# Patient Record
Sex: Female | Born: 1960 | Race: White | Hispanic: No | Marital: Married | Smoking: Former smoker
Health system: Southern US, Community
[De-identification: ages and names within clinical notes are randomized; demographics above are authoritative.]

## PROBLEM LIST (undated history)

## (undated) DIAGNOSIS — C50411 Malignant neoplasm of upper-outer quadrant of right female breast: Secondary | ICD-10-CM

## (undated) DIAGNOSIS — M1990 Unspecified osteoarthritis, unspecified site: Secondary | ICD-10-CM

## (undated) DIAGNOSIS — C50919 Malignant neoplasm of unspecified site of unspecified female breast: Secondary | ICD-10-CM

## (undated) DIAGNOSIS — R011 Cardiac murmur, unspecified: Secondary | ICD-10-CM

## (undated) DIAGNOSIS — K219 Gastro-esophageal reflux disease without esophagitis: Secondary | ICD-10-CM

## (undated) DIAGNOSIS — C801 Malignant (primary) neoplasm, unspecified: Secondary | ICD-10-CM

## (undated) DIAGNOSIS — T7840XA Allergy, unspecified, initial encounter: Secondary | ICD-10-CM

## (undated) HISTORY — DX: Gastro-esophageal reflux disease without esophagitis: K21.9

## (undated) HISTORY — PX: OTHER SURGICAL HISTORY: SHX169

## (undated) HISTORY — DX: Malignant (primary) neoplasm, unspecified: C80.1

## (undated) HISTORY — DX: Unspecified osteoarthritis, unspecified site: M19.90

## (undated) HISTORY — DX: Allergy, unspecified, initial encounter: T78.40XA

## (undated) HISTORY — PX: DILATION AND CURETTAGE OF UTERUS: SHX78

## (undated) HISTORY — DX: Cardiac murmur, unspecified: R01.1

## (undated) HISTORY — DX: Malignant neoplasm of unspecified site of unspecified female breast: C50.919

## (undated) HISTORY — PX: UPPER GI ENDOSCOPY: SHX6162

## (undated) HISTORY — PX: MASTECTOMY: SHX3

---
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
## 2001-09-18 ENCOUNTER — Other Ambulatory Visit: Admission: RE | Admit: 2001-09-18 | Discharge: 2001-09-18 | Payer: Self-pay | Admitting: Family Medicine

## 2004-09-04 ENCOUNTER — Ambulatory Visit: Payer: Self-pay | Admitting: Gastroenterology

## 2008-07-04 DIAGNOSIS — I341 Nonrheumatic mitral (valve) prolapse: Secondary | ICD-10-CM | POA: Insufficient documentation

## 2010-04-03 ENCOUNTER — Ambulatory Visit: Payer: Self-pay | Admitting: Family Medicine

## 2010-04-16 ENCOUNTER — Ambulatory Visit: Payer: Self-pay | Admitting: Family Medicine

## 2010-05-11 ENCOUNTER — Ambulatory Visit: Payer: Self-pay | Admitting: General Surgery

## 2010-05-11 DIAGNOSIS — C50919 Malignant neoplasm of unspecified site of unspecified female breast: Secondary | ICD-10-CM

## 2010-05-11 HISTORY — DX: Malignant neoplasm of unspecified site of unspecified female breast: C50.919

## 2010-05-13 ENCOUNTER — Ambulatory Visit: Payer: Self-pay | Admitting: General Surgery

## 2010-05-14 LAB — CANCER ANTIGEN 27.29: CA 27.29: 19 U/mL (ref 0.0–38.6)

## 2010-05-14 LAB — CEA: CEA: 3.1 ng/mL (ref 0.0–4.7)

## 2010-05-26 ENCOUNTER — Ambulatory Visit: Payer: Self-pay | Admitting: General Surgery

## 2010-05-28 ENCOUNTER — Ambulatory Visit: Payer: Self-pay | Admitting: General Surgery

## 2010-05-28 DIAGNOSIS — C801 Malignant (primary) neoplasm, unspecified: Secondary | ICD-10-CM

## 2010-05-28 HISTORY — PX: BREAST SURGERY: SHX581

## 2010-05-28 HISTORY — DX: Malignant (primary) neoplasm, unspecified: C80.1

## 2010-06-09 ENCOUNTER — Ambulatory Visit: Payer: Self-pay | Admitting: Oncology

## 2010-06-14 ENCOUNTER — Ambulatory Visit: Payer: Self-pay | Admitting: Oncology

## 2010-06-14 HISTORY — PX: COLONOSCOPY: SHX174

## 2010-06-23 ENCOUNTER — Ambulatory Visit: Payer: Self-pay | Admitting: Oncology

## 2010-06-29 ENCOUNTER — Ambulatory Visit: Payer: Self-pay | Admitting: Oncology

## 2010-07-15 ENCOUNTER — Ambulatory Visit: Payer: Self-pay | Admitting: Oncology

## 2010-09-07 ENCOUNTER — Ambulatory Visit: Payer: Self-pay | Admitting: Oncology

## 2010-09-08 LAB — CANCER ANTIGEN 27.29: CA 27.29: 13.7 U/mL (ref 0.0–38.6)

## 2010-09-13 ENCOUNTER — Ambulatory Visit: Payer: Self-pay | Admitting: Oncology

## 2010-10-02 ENCOUNTER — Ambulatory Visit: Payer: Self-pay | Admitting: Family Medicine

## 2010-12-03 ENCOUNTER — Ambulatory Visit: Payer: Self-pay

## 2010-12-10 ENCOUNTER — Ambulatory Visit: Payer: Self-pay

## 2010-12-14 ENCOUNTER — Ambulatory Visit: Payer: Self-pay | Admitting: Oncology

## 2010-12-15 LAB — CANCER ANTIGEN 27.29: CA 27.29: 15.2 U/mL (ref 0.0–38.6)

## 2011-01-13 ENCOUNTER — Ambulatory Visit: Payer: Self-pay | Admitting: Oncology

## 2011-03-09 ENCOUNTER — Ambulatory Visit: Payer: Self-pay | Admitting: General Surgery

## 2011-03-09 HISTORY — PX: COLONOSCOPY: SHX174

## 2011-03-22 ENCOUNTER — Ambulatory Visit: Payer: Self-pay | Admitting: Oncology

## 2011-04-15 ENCOUNTER — Ambulatory Visit: Payer: Self-pay | Admitting: Oncology

## 2011-05-15 ENCOUNTER — Ambulatory Visit: Payer: Self-pay | Admitting: Oncology

## 2011-05-27 ENCOUNTER — Ambulatory Visit: Payer: Self-pay | Admitting: General Surgery

## 2011-05-27 HISTORY — PX: BREAST SURGERY: SHX581

## 2011-05-31 LAB — PATHOLOGY REPORT

## 2011-06-22 ENCOUNTER — Ambulatory Visit: Payer: Self-pay | Admitting: Oncology

## 2011-06-23 LAB — CANCER ANTIGEN 27.29: CA 27.29: 12.9 U/mL (ref 0.0–38.6)

## 2011-07-16 ENCOUNTER — Ambulatory Visit: Payer: Self-pay | Admitting: Oncology

## 2011-12-24 ENCOUNTER — Ambulatory Visit: Payer: Self-pay | Admitting: Oncology

## 2011-12-25 LAB — CANCER ANTIGEN 27.29: CA 27.29: 16.4 U/mL (ref 0.0–38.6)

## 2012-01-13 ENCOUNTER — Ambulatory Visit: Payer: Self-pay | Admitting: Oncology

## 2012-03-02 ENCOUNTER — Ambulatory Visit: Payer: Self-pay | Admitting: Otolaryngology

## 2012-06-22 ENCOUNTER — Ambulatory Visit: Payer: Self-pay | Admitting: Oncology

## 2012-07-15 ENCOUNTER — Ambulatory Visit: Payer: Self-pay | Admitting: Oncology

## 2012-08-30 ENCOUNTER — Telehealth: Payer: Self-pay | Admitting: General Surgery

## 2012-08-30 NOTE — Telephone Encounter (Signed)
08-30-12 PT NEEDS A  SCRIPT FOR PROSTHESIS BILAT & FOR BRAS.PLEASE CALL PT WHEN [phone])/MTH

## 2012-08-31 NOTE — Telephone Encounter (Signed)
RX for bilateral breast prosthesis and mastectomy bras x 4 mailed to patient.

## 2012-12-12 ENCOUNTER — Ambulatory Visit: Payer: Self-pay | Admitting: Oncology

## 2012-12-12 ENCOUNTER — Encounter: Payer: Self-pay | Admitting: *Deleted

## 2012-12-13 ENCOUNTER — Encounter: Payer: Self-pay | Admitting: *Deleted

## 2012-12-25 ENCOUNTER — Ambulatory Visit (INDEPENDENT_AMBULATORY_CARE_PROVIDER_SITE_OTHER): Payer: Managed Care, Other (non HMO) | Admitting: General Surgery

## 2012-12-25 ENCOUNTER — Encounter: Payer: Self-pay | Admitting: General Surgery

## 2012-12-25 VITALS — BP 112/64 | HR 74 | Resp 12 | Ht 65.0 in | Wt 143.0 lb

## 2012-12-25 DIAGNOSIS — K429 Umbilical hernia without obstruction or gangrene: Secondary | ICD-10-CM

## 2012-12-25 NOTE — Patient Instructions (Signed)
Patient to return in January as scheduled.

## 2012-12-25 NOTE — Progress Notes (Signed)
Patient ID: Donna Hoffman, female   DOB: [birth year], 52 y.o.   MRN: [id number]  Chief Complaint  Patient presents with  . Other    evaluation of hernia    HPI Donna Hoffman is a 52 y.o. female who presents for an umbilical hernia evaluation. This hernia has been noted in the past but was unsymptomatic at that time. She states she started to have abdominal pain around the navel for a couple of weeks off and on. She states the pain has subsided. No complaints at this time.   HPI  Past Medical History  Diagnosis Date  . Heart murmur   . GERD (gastroesophageal reflux disease)   . Cancer 1324,4010    breast    Past Surgical History  Procedure Laterality Date  . Dilation and curettage of uterus    . Upper gi endoscopy    . Mastectomy Bilateral 2011,2012  . Colonoscopy  2012  . Breast surgery Bilateral 2011,2012    BIL MASTECTOMY  . Breast surgery Right 2011    Wide excision    Family History  Problem Relation Age of Onset  . Breast cancer Mother   . Colon cancer Paternal Grandfather   . Colon polyps Father     Social History History  Substance Use Topics  . Smoking status: Former Smoker -- 5 years  . Smokeless tobacco: Never Used  . Alcohol Use: No    Allergies  Allergen Reactions  . Neomycin Hives  . Other Other (See Comments)    polymycin - blisters  . Penicillins Itching    Current Outpatient Prescriptions  Medication Sig Dispense Refill  . cholecalciferol (VITAMIN D) 1000 UNITS tablet Take 1,000 Units by mouth daily.      . meloxicam (MOBIC) 15 MG tablet Take 1 tablet by mouth as needed.       No current facility-administered medications for this visit.    Review of Systems Review of Systems  Constitutional: Negative.   Respiratory: Negative.   Cardiovascular: Negative.   Gastrointestinal: Positive for abdominal pain.    Blood pressure 112/64, pulse 74, resp. rate 12, height 5\' 5"  (1.651 m), weight 143 lb (64.864 kg), last menstrual period  12/05/2012.  Physical Exam Physical Exam  Constitutional: She is oriented to person, place, and time. She appears well-developed and well-nourished.  Neck: No thyromegaly present.  Cardiovascular: Normal rate, regular rhythm and normal heart sounds.   No murmur heard. Pulmonary/Chest: Effort normal and breath sounds normal.  Abdominal: Soft. Normal appearance. There is tenderness in the periumbilical area. A hernia is present.  There is a less than 1 cm fascial defect evident at the umbilicus. This is reducible.  Lymphadenopathy:    She has no cervical adenopathy.  Neurological: She is alert and oriented to person, place, and time.  Skin: Skin is warm.    Data Reviewed No new data.  Assessment    Small umbilical hernia, minimally symptomatic     Plan    The patient will report if the area becomes more symptomatic. Follow up otherwise will be as previously scheduled in regards to her breast cancer.        Earline Mayotte 12/26/2012, 9:54 PM

## 2012-12-26 ENCOUNTER — Encounter: Payer: Self-pay | Admitting: General Surgery

## 2013-01-12 ENCOUNTER — Ambulatory Visit: Payer: Self-pay | Admitting: Oncology

## 2013-04-19 ENCOUNTER — Other Ambulatory Visit: Payer: Self-pay

## 2013-04-25 ENCOUNTER — Ambulatory Visit: Payer: Self-pay | Admitting: Obstetrics & Gynecology

## 2013-04-25 LAB — PROTIME-INR
INR: 1
Prothrombin Time: 13.5 secs (ref 11.5–14.7)

## 2013-04-25 LAB — CBC
HCT: 35.5 % (ref 35.0–47.0)
MCH: 30.5 pg (ref 26.0–34.0)
Platelet: 299 10*3/uL (ref 150–440)
RBC: 4.02 10*6/uL (ref 3.80–5.20)
WBC: 8.3 10*3/uL (ref 3.6–11.0)

## 2013-05-04 ENCOUNTER — Ambulatory Visit: Payer: Self-pay | Admitting: Obstetrics & Gynecology

## 2013-05-04 HISTORY — PX: ABDOMINAL HYSTERECTOMY: SHX81

## 2013-05-05 LAB — HEMOGLOBIN: HGB: 11.2 g/dL — ABNORMAL LOW (ref 12.0–16.0)

## 2013-06-29 ENCOUNTER — Ambulatory Visit: Payer: Self-pay | Admitting: Oncology

## 2013-06-30 LAB — CANCER ANTIGEN 27.29: CA 27.29: 10 U/mL (ref 0.0–38.6)

## 2013-07-04 ENCOUNTER — Ambulatory Visit (INDEPENDENT_AMBULATORY_CARE_PROVIDER_SITE_OTHER): Payer: Managed Care, Other (non HMO) | Admitting: General Surgery

## 2013-07-04 ENCOUNTER — Encounter: Payer: Self-pay | Admitting: General Surgery

## 2013-07-04 VITALS — BP 130/76 | HR 74 | Resp 12 | Ht 65.0 in | Wt 141.0 lb

## 2013-07-04 DIAGNOSIS — Z853 Personal history of malignant neoplasm of breast: Secondary | ICD-10-CM | POA: Insufficient documentation

## 2013-07-04 NOTE — Patient Instructions (Signed)
Patient to return in 1 year for follow up.  

## 2013-07-04 NOTE — Progress Notes (Signed)
Patient ID: Donna Hoffman, female   DOB: [birth year], 53 y.o.   MRN: [id number]  Chief Complaint  Patient presents with  . Follow-up    breast cancer    HPI Donna Hoffman is a 53 y.o. female who presents for a breast cancer follow up evaluation. The patient denies any new problems with the breasts at this time. No mammograms done at this time.   HPI  Past Medical History  Diagnosis Date  . Heart murmur   . GERD (gastroesophageal reflux disease)   . Cancer 5277,8242    breast    Past Surgical History  Procedure Laterality Date  . Dilation and curettage of uterus    . Upper gi endoscopy    . Mastectomy Bilateral 2011,2012  . Colonoscopy  2012  . Breast surgery Bilateral 2011,2012    BIL MASTECTOMY  . Breast surgery Right 2011    Wide excision  . Abdominal hysterectomy  05/04/13    total hysterectomy    Family History  Problem Relation Age of Onset  . Breast cancer Mother   . Colon cancer Paternal Grandfather   . Colon polyps Father     Social History History  Substance Use Topics  . Smoking status: Former Smoker -- 5 years  . Smokeless tobacco: Never Used  . Alcohol Use: No    Allergies  Allergen Reactions  . Neomycin Hives  . Other Other (See Comments)    polymycin - blisters  . Penicillins Itching    Current Outpatient Prescriptions  Medication Sig Dispense Refill  . cholecalciferol (VITAMIN D) 1000 UNITS tablet Take 1,000 Units by mouth daily.      . meloxicam (MOBIC) 15 MG tablet Take 1 tablet by mouth as needed.       No current facility-administered medications for this visit.    Review of Systems Review of Systems  Constitutional: Negative.   Respiratory: Negative.   Cardiovascular: Negative.     Blood pressure 130/76, pulse 74, resp. rate 12, height 5\' 5"  (1.651 m), weight 141 lb (63.957 kg), last menstrual period 12/05/2012.  Physical Exam Physical Exam  Constitutional: She is oriented to person, place, and time. She appears  well-developed and well-nourished.  Neck: Neck supple. No thyromegaly present.  Cardiovascular: Normal rate, regular rhythm and normal heart sounds.   No murmur heard. Pulmonary/Chest: Effort normal and breath sounds normal.  Bilateral mastectomy sites well healed.   Lymphadenopathy:    She has no cervical adenopathy.    She has no axillary adenopathy.  Neurological: She is alert and oriented to person, place, and time.  Skin: Skin is warm and dry.    Data Reviewed The patient reports that her CEA 27-29 level is stable as reported by Dr. Grayland Ormond  Assessment    Benign exam, no evidence of right breast cancer recurrence.    Plan    A prescription for new prostheses as well as surgical bra was provided. The patient was offered the opportunity to have her clinical exam was completed with her PCP or Dr. Grayland Ormond. She declined. We'll plan for a followup examination in one year.  The patient reports significant vasomotor symptoms since completion of her hysterectomy and ovary removal 8 weeks ago.  The role of fracture or to control the symptoms was discussed if they do not resolve over the next 4-6 weeks.  She had a ER/PR-negative tumor, estrogen replacement is not totally contraindicated if an SSRI is not of benefit.  Robert Bellow 07/05/2013, 5:27 PM

## 2013-07-05 ENCOUNTER — Encounter: Payer: Self-pay | Admitting: General Surgery

## 2013-07-15 ENCOUNTER — Ambulatory Visit: Payer: Self-pay | Admitting: Oncology

## 2013-07-19 ENCOUNTER — Encounter: Payer: Self-pay | Admitting: General Surgery

## 2013-07-30 ENCOUNTER — Encounter: Payer: Self-pay | Admitting: General Surgery

## 2014-04-15 ENCOUNTER — Encounter: Payer: Self-pay | Admitting: General Surgery

## 2014-06-25 ENCOUNTER — Encounter: Payer: Self-pay | Admitting: General Surgery

## 2014-06-25 ENCOUNTER — Ambulatory Visit (INDEPENDENT_AMBULATORY_CARE_PROVIDER_SITE_OTHER): Payer: Managed Care, Other (non HMO) | Admitting: General Surgery

## 2014-06-25 VITALS — BP 124/80 | HR 78 | Resp 12 | Ht 65.0 in | Wt 149.0 lb

## 2014-06-25 DIAGNOSIS — Z853 Personal history of malignant neoplasm of breast: Secondary | ICD-10-CM

## 2014-06-25 NOTE — Progress Notes (Signed)
Patient ID: Donna Hoffman, female   DOB: May 05, 1961, 54 y.o.   MRN: [id number]  Chief Complaint  Patient presents with  . Breast Cancer Long Term Follow Up    HPI Donna Hoffman is a 54 y.o. female.  Here today for her follow up from left breast cancer and bilateral mastectomies. She states she is doing well and still having hot flashes. She sates she does not seem to be bothered by them at night mostly during the fay. No new issues.   Her hernia does not seem to be bothering her at this time.  HPI  Past Medical History  Diagnosis Date  . Heart murmur   . GERD (gastroesophageal reflux disease)   . 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  Plan adjuvant chemotherapy.  . Malignant neoplasm of breast (female), unspecified site 05/11/2010    Invasive ductal carcinoma, extensive DCIS. BRCA negative    Past Surgical History  Procedure Laterality Date  . Dilation and curettage of uterus    . Upper gi endoscopy    . Mastectomy Bilateral 2011,2012  . Colonoscopy  2012  . Abdominal hysterectomy  05/04/13    total hysterectomy  . Colonoscopy  03/09/2011    Normal exam  . Breast surgery Left 05/27/2011    Left simple mastectomy for benign disease.  . Breast surgery Right 05/28/2010    Right simple mastectomy/sentinel node biopsy    Family History  Problem Relation Age of Onset  . Breast cancer Mother     x2  . Colon cancer Paternal Grandfather   . Colon polyps Father     Social History History  Substance Use Topics  . Smoking status: Former Smoker -- 5 years  . Smokeless tobacco: Never Used  . Alcohol Use: No    Allergies  Allergen Reactions  . Neomycin Hives  . Other Other (See Comments)    polymycin - blisters  . Penicillins Itching    Current Outpatient Prescriptions  Medication Sig Dispense Refill  . cholecalciferol (VITAMIN D) 1000 UNITS tablet Take 1,000 Units by mouth daily.     . meloxicam (MOBIC) 15 MG tablet Take 1 tablet by mouth as needed.     No current facility-administered medications for this visit.    Review of Systems Review of Systems  Constitutional: Negative.   Respiratory: Negative.   Cardiovascular: Negative.     Blood pressure 124/80, pulse 78, resp. rate 12, height 5\' 5"  (1.651 m), weight 149 lb (67.586 kg), last menstrual period 12/05/2012.  Physical Exam Physical Exam  Constitutional: She is oriented to person, place, and time. She appears well-developed and well-nourished.  Neck: Neck supple.  Cardiovascular: Normal rate, regular rhythm and normal heart sounds.   Pulmonary/Chest: Effort normal and breath sounds normal.  Bilateral chest wall incisions well healed. No upper extremity swelling. Full range of motion.  Lymphadenopathy:    She has no cervical adenopathy.    She has no axillary adenopathy.  Neurological: She is alert and oriented to person, place, and time.  Skin: Skin is warm and dry.    Data Reviewed The patient reports BRCA testing completed with Dr. 12/07/2012 was negative except for a variant of unknown significance.  Assessment    Doing well now 4 years status post right mastectomy for T1c malignancy.  Contralateral mastectomy for benign disease.     Plan    The patient desires to  continue annual clinical follow-up.Marland Kitchen Reexamination one year.  We discussed the role for Effexor for hot flashes. At this time she is not interested.     Follow up in one year or sooner if needed.   PCP:  Donna Hoffman Dr. Tonny Hoffman, Donna Hoffman 06/25/2014, 4:13 PM

## 2014-06-25 NOTE — Patient Instructions (Addendum)
The patient is aware to call back for any questions or concerns. Follow up in one year or sooner if needed.

## 2014-07-12 ENCOUNTER — Ambulatory Visit: Payer: Self-pay | Admitting: Oncology

## 2014-07-15 ENCOUNTER — Ambulatory Visit: Payer: Self-pay | Admitting: Oncology

## 2014-07-15 LAB — CANCER ANTIGEN 27.29: CA 27.29: 6.1 U/mL (ref 0.0–38.6)

## 2014-10-04 NOTE — Op Note (Signed)
PATIENT NAME:  Donna Hoffman, Donna Hoffman MR#:  [id number] DATE OF BIRTH:  1960/07/02  DATE OF PROCEDURE:  05/04/2013  PREOPERATIVE DIAGNOSIS: Menorrhagia.   POSTOPERATIVE DIAGNOSIS: Menorrhagia.   PROCEDURE PERFORMED: Complete laparoscopic hysterectomy with bilateral salpingo-oophorectomy.   SURGEON: Barnett Applebaum, M.D.   ANESTHESIA: General.   ESTIMATED BLOOD LOSS: 25 mL.   COMPLICATIONS: None.   FINDINGS: Normal tubes and ovaries with a slightly enlarged uterus, normal appendix, bowel and colon.   DISPOSITION: To the recovery room in stable condition.   TECHNIQUE: The patient is prepped and draped in the usual sterile fashion after adequate anesthesia is obtained in the dorsal lithotomy position. A Foley catheter is placed. A V-Care device is placed on the cervix after the uterus is sounded to 7 cm.   Attention is then turned to the abdomen where a Veress needle is inserted through a 5 mm infraumbilical incision after Marcaine is used to anesthetize the skin. Veress needle placement is confirmed using the hanging drop technique and the abdomen is then insufflated with CO2 gas. A 5 mm trocar is then inserted under direct visualization with the laparoscope with no injuries or bleeding noted. The patient is placed in Trendelenburg position and the above-mentioned findings are visualized.   The right lower quadrant, an 11 mm laparoscopic trocar is placed lateral to the inferior epigastric blood vessels with no injuries or bleeding noted. A 5 mm trocar is placed in the left lower quadrant lateral to the inferior epigastric blood vessels. The Harmonic scalpel was then utilized to coagulate and cut the infundibulopelvic blood vessels and their ligaments to completely amputate the adnexa from their attachments. Dissection is carried up to the round ligaments,  which are carefully coagulated and cut and then the anterior and posterior leaves of the broad ligament are carefully dissected. The uterine arteries are  coagulated using the bipolar cautery device and then they are severed. At this point, the uterus appears to blanch. The cervicovaginal junction is identified with the V-Care device tenting through the structures and a circumferential incision is made using the Harmonic scalpel to completely amputate the uterus with cervix along with the adnexa. It is then removed per the vagina and a sponge is placed to maintain pneumoperitoneum.   The vaginal cuff is closed with Polysorb 0 sutures in an interrupted fashion using the Endo Stitch device. Five sutures are applied and bimanual exam confirms complete closure without leakage of gas. The pelvic cavity is then irrigated with aspiration of all fluid. The patient is leveled. Trocars are removed, gas is expelled, and the skin is closed with Dermabond. The patient goes to recovery room in stable condition. All sponge, instrument, and needle counts are correct.   ____________________________ R. Barnett Applebaum, MD rph:aw D: 05/04/2013 10:54:25 ET T: 05/04/2013 11:06:27 ET JOB#: [id number]  cc: Glean Salen, MD, <Dictator> Gae Dry MD ELECTRONICALLY SIGNED 05/04/2013 15:00

## 2015-01-02 ENCOUNTER — Telehealth: Payer: Self-pay | Admitting: Family Medicine

## 2015-01-02 DIAGNOSIS — R748 Abnormal levels of other serum enzymes: Secondary | ICD-10-CM

## 2015-01-02 NOTE — Telephone Encounter (Signed)
Printed, please notify patient.

## 2015-01-02 NOTE — Telephone Encounter (Signed)
Pt advised.   Thanks,   -Laura  

## 2015-01-02 NOTE — Telephone Encounter (Signed)
Pt is requesting a lab slip to recheck liver enzymes. [id number]

## 2015-01-04 LAB — COMPREHENSIVE METABOLIC PANEL
A/G RATIO: 1.8 (ref 1.1–2.5)
ALT: 13 IU/L (ref 0–32)
AST: 12 IU/L (ref 0–40)
Albumin: 4.3 g/dL (ref 3.5–5.5)
Alkaline Phosphatase: 139 IU/L — ABNORMAL HIGH (ref 39–117)
BILIRUBIN TOTAL: 0.3 mg/dL (ref 0.0–1.2)
BUN/Creatinine Ratio: 25 — ABNORMAL HIGH (ref 9–23)
BUN: 15 mg/dL (ref 6–24)
CO2: 26 mmol/L (ref 18–29)
Calcium: 9.7 mg/dL (ref 8.7–10.2)
Chloride: 100 mmol/L (ref 97–108)
Creatinine, Ser: 0.61 mg/dL (ref 0.57–1.00)
GFR, EST AFRICAN AMERICAN: 119 mL/min/{1.73_m2} (ref >59)
GFR, EST NON AFRICAN AMERICAN: 103 mL/min/{1.73_m2} (ref >59)
Globulin, Total: 2.4 g/dL (ref 1.5–4.5)
Glucose: 84 mg/dL (ref 65–99)
POTASSIUM: 4.4 mmol/L (ref 3.5–5.2)
Sodium: 142 mmol/L (ref 134–144)
Total Protein: 6.7 g/dL (ref 6.0–8.5)

## 2015-01-07 ENCOUNTER — Telehealth: Payer: Self-pay

## 2015-01-07 NOTE — Telephone Encounter (Signed)
-----   Message from Margarita Rana, MD sent at 01/06/2015  1:32 PM EDT ----- The same liver enzyme is still elevated. Very mildly so.  Recommend check ALP isoenzymes in one week. Thanks.

## 2015-01-07 NOTE — Telephone Encounter (Signed)
Pt advised as directed below.  Thanks,   -Joseline

## 2015-01-10 ENCOUNTER — Other Ambulatory Visit: Payer: Self-pay | Admitting: Family Medicine

## 2015-01-10 ENCOUNTER — Other Ambulatory Visit: Payer: Self-pay

## 2015-01-10 DIAGNOSIS — R748 Abnormal levels of other serum enzymes: Secondary | ICD-10-CM

## 2015-01-15 LAB — ALKALINE PHOSPHATASE, ISOENZYMES
BONE FRACTION: 62 % (ref 14–68)
INTESTINAL FRAC.: 10 % (ref 0–18)
LIVER FRACTION: 28 % (ref 18–85)

## 2015-01-15 LAB — COMPREHENSIVE METABOLIC PANEL
A/G RATIO: 2 (ref 1.1–2.5)
ALK PHOS: 136 IU/L — AB (ref 39–117)
ALT: 14 IU/L (ref 0–32)
AST: 15 IU/L (ref 0–40)
Albumin: 4.5 g/dL (ref 3.5–5.5)
BUN/Creatinine Ratio: 25 — ABNORMAL HIGH (ref 9–23)
BUN: 17 mg/dL (ref 6–24)
Bilirubin Total: 0.3 mg/dL (ref 0.0–1.2)
CALCIUM: 9.9 mg/dL (ref 8.7–10.2)
CO2: 25 mmol/L (ref 18–29)
CREATININE: 0.67 mg/dL (ref 0.57–1.00)
Chloride: 103 mmol/L (ref 97–108)
GFR calc Af Amer: 115 mL/min/{1.73_m2} (ref >59)
GFR calc non Af Amer: 100 mL/min/{1.73_m2} (ref >59)
GLOBULIN, TOTAL: 2.2 g/dL (ref 1.5–4.5)
Glucose: 74 mg/dL (ref 65–99)
Potassium: 4.6 mmol/L (ref 3.5–5.2)
SODIUM: 145 mmol/L — AB (ref 134–144)
Total Protein: 6.7 g/dL (ref 6.0–8.5)

## 2015-01-16 ENCOUNTER — Telehealth: Payer: Self-pay

## 2015-01-16 DIAGNOSIS — R748 Abnormal levels of other serum enzymes: Secondary | ICD-10-CM

## 2015-01-16 NOTE — Telephone Encounter (Signed)
Put in order for patient. They should be calling her with appointment time.  Thanks.

## 2015-01-16 NOTE — Telephone Encounter (Signed)
Left message to call back  

## 2015-01-16 NOTE — Telephone Encounter (Signed)
LMTCB 01/16/2015  Thanks,   -Mickel Baas

## 2015-01-16 NOTE — Telephone Encounter (Signed)
Advised pt as directed below.she would like to know if you would order an Ultrasound.  Thanks,

## 2015-01-16 NOTE — Telephone Encounter (Signed)
-----  Message from Margarita Rana, MD sent at 01/16/2015  6:42 AM EDT ----- Isoenzymes look good. Would repeat met c in 3 months for stability. Thanks.

## 2015-01-17 ENCOUNTER — Telehealth: Payer: Self-pay | Admitting: Family Medicine

## 2015-01-22 ENCOUNTER — Ambulatory Visit: Payer: Managed Care, Other (non HMO)

## 2015-01-28 ENCOUNTER — Ambulatory Visit
Admission: RE | Admit: 2015-01-28 | Discharge: 2015-01-28 | Disposition: A | Discharge status: Home / Self Care | Payer: Managed Care, Other (non HMO) | Source: Ambulatory Visit | Attending: Family Medicine | Admitting: Family Medicine

## 2015-01-28 ENCOUNTER — Telehealth: Payer: Self-pay

## 2015-01-28 DIAGNOSIS — R748 Abnormal levels of other serum enzymes: Secondary | ICD-10-CM | POA: Diagnosis present

## 2015-01-28 NOTE — Telephone Encounter (Signed)
Advised pt as directed below, pt verbalized fully understanding.  Thanks

## 2015-01-28 NOTE — Telephone Encounter (Signed)
-----   Message from Margarita Rana, MD sent at 01/28/2015  9:54 AM EDT ----- Normal Ultrasound. Please notify patient. Repeat labs in several months as discussed.  Thanks.

## 2015-05-14 ENCOUNTER — Encounter: Payer: Self-pay | Admitting: *Deleted

## 2015-06-05 ENCOUNTER — Encounter: Payer: Self-pay | Admitting: Family Medicine

## 2015-06-05 ENCOUNTER — Ambulatory Visit
Admission: RE | Admit: 2015-06-05 | Discharge: 2015-06-05 | Disposition: A | Discharge status: Home / Self Care | Payer: Managed Care, Other (non HMO) | Source: Ambulatory Visit | Attending: Family Medicine | Admitting: Family Medicine

## 2015-06-05 ENCOUNTER — Ambulatory Visit (INDEPENDENT_AMBULATORY_CARE_PROVIDER_SITE_OTHER): Payer: Managed Care, Other (non HMO) | Admitting: Family Medicine

## 2015-06-05 VITALS — BP 130/80 | HR 68 | Temp 97.9°F | Resp 16 | Ht 65.0 in | Wt 153.0 lb

## 2015-06-05 DIAGNOSIS — E559 Vitamin D deficiency, unspecified: Secondary | ICD-10-CM

## 2015-06-05 DIAGNOSIS — Z0000 Encounter for general adult medical examination without abnormal findings: Secondary | ICD-10-CM | POA: Diagnosis not present

## 2015-06-05 DIAGNOSIS — M7712 Lateral epicondylitis, left elbow: Secondary | ICD-10-CM

## 2015-06-05 DIAGNOSIS — I889 Nonspecific lymphadenitis, unspecified: Secondary | ICD-10-CM | POA: Insufficient documentation

## 2015-06-05 DIAGNOSIS — N926 Irregular menstruation, unspecified: Secondary | ICD-10-CM | POA: Insufficient documentation

## 2015-06-05 DIAGNOSIS — C50411 Malignant neoplasm of upper-outer quadrant of right female breast: Secondary | ICD-10-CM | POA: Insufficient documentation

## 2015-06-05 DIAGNOSIS — M25559 Pain in unspecified hip: Secondary | ICD-10-CM | POA: Insufficient documentation

## 2015-06-05 DIAGNOSIS — E7800 Pure hypercholesterolemia, unspecified: Secondary | ICD-10-CM | POA: Insufficient documentation

## 2015-06-05 DIAGNOSIS — Z6825 Body mass index (BMI) 25.0-25.9, adult: Secondary | ICD-10-CM | POA: Insufficient documentation

## 2015-06-05 DIAGNOSIS — R748 Abnormal levels of other serum enzymes: Secondary | ICD-10-CM

## 2015-06-05 DIAGNOSIS — M1990 Unspecified osteoarthritis, unspecified site: Secondary | ICD-10-CM

## 2015-06-05 DIAGNOSIS — M5136 Other intervertebral disc degeneration, lumbar region: Secondary | ICD-10-CM | POA: Insufficient documentation

## 2015-06-05 DIAGNOSIS — M542 Cervicalgia: Secondary | ICD-10-CM | POA: Insufficient documentation

## 2015-06-05 DIAGNOSIS — D709 Neutropenia, unspecified: Secondary | ICD-10-CM | POA: Insufficient documentation

## 2015-06-05 DIAGNOSIS — M719 Bursopathy, unspecified: Secondary | ICD-10-CM | POA: Insufficient documentation

## 2015-06-05 DIAGNOSIS — M545 Low back pain: Secondary | ICD-10-CM | POA: Diagnosis present

## 2015-06-05 HISTORY — DX: Malignant neoplasm of upper-outer quadrant of right female breast: C50.411

## 2015-06-05 NOTE — Progress Notes (Signed)
Patient ID: Donna Hoffman, female   DOB: 1961-05-26, 54 y.o.   MRN: [id number]        Patient: Donna Hoffman, Female    DOB: 09/06/1960, 54 y.o.   MRN: [id number] Visit Date: 06/05/2015  Today's Provider: Margarita Rana, MD   Chief Complaint  Patient presents with  . Annual Exam   Subjective:    Annual physical exam Donna Hoffman is a 54 y.o. female who presents today for health maintenance and complete physical. She feels well. She reports exercising staying active with daily activities. She reports she is sleeping well.  05/24/14 CPE 08/01/08 Pap-neg; HPV-neg, hysterectomy on 04/2013 03/05/11 Colon-normal -----------------------------------------------------------------  Back Pain: Patient presents for presents evaluation of low back problems.  Symptoms have been present for 1 month and include pain in lower back (aching in character; 7/10 in severity) and stiffness in lower back. Initial inciting event: none. Symptoms are worst: morning. Alleviating factors identifiable by patient are heat. Exacerbating factors identifiable by patient are standing. Treatments so far initiated by patient: mobic Previous lower back problems: osteoarthirits. Previous workup: none. Previous treatments: none.  Numbness/Muscle Pain: Patient complains of numbness for which has been present on and off for 2 months. Numbness is located in left arm and is intermittent.  Associated symptoms include: none.  The patient has tried nothing for pain relief.  Related to injury:   Yes, pt reports she had a fall in the summer.   Review of Systems  Constitutional: Negative.   HENT: Negative.   Eyes: Negative.   Respiratory: Negative.   Cardiovascular: Negative.   Gastrointestinal: Negative.   Endocrine: Negative.   Genitourinary: Negative.   Musculoskeletal: Positive for back pain and arthralgias.  Allergic/Immunologic: Negative.   Neurological: Positive for numbness.  Hematological: Negative.     Psychiatric/Behavioral: Negative.     Social History      She  reports that she has quit smoking. She has never used smokeless tobacco. She reports that she does not drink alcohol or use illicit drugs.       Social History   Social History  . Marital Status: Married    Spouse Name: N/A  . Number of Children: N/A  . Years of Education: N/A   Social History Main Topics  . Smoking status: Former Smoker -- 5 years  . Smokeless tobacco: Never Used  . Alcohol Use: No  . Drug Use: No  . Sexual Activity: Not Asked   Other Topics Concern  . None   Social History Narrative    Patient Active Problem List   Diagnosis Date Noted  . Body mass index (BMI) of 25.0-25.9 in adult 06/05/2015  . Malignant neoplasm of breast ([place]) 06/05/2015  . Bursitis 06/05/2015  . Hypercholesterolemia 06/05/2015  . Irregular bleeding 06/05/2015  . Arthralgia of hip 06/05/2015  . Adenitis 06/05/2015  . Cervical pain 06/05/2015  . Neutropenia ([place]) 06/05/2015  . Avitaminosis D 06/05/2015  . Elevated liver enzymes 01/02/2015  . History of breast cancer 07/04/2013  . Umbilical hernia [id number]  . Allergic rhinitis 07/29/2008  . Acid reflux 07/04/2008  . Disorder of mitral valve 07/04/2008    Past Surgical History  Procedure Laterality Date  . Dilation and curettage of uterus    . Upper gi endoscopy    . Mastectomy Bilateral 2011,2012  . Colonoscopy  2012  . Abdominal hysterectomy  05/04/13    total hysterectomy  . Colonoscopy  03/09/2011    Normal exam  . Breast surgery Left  05/27/2011    Left simple mastectomy for benign disease.  . Breast surgery Right 05/28/2010    Right simple mastectomy/sentinel node biopsy    Family History        Family Status  Relation Status Death Age  . Mother Alive   . Father Alive   . Sister Alive   . Brother Alive   . Sister Alive   . Sister Alive         Her family history includes Arthritis in her mother; Breast cancer in her mother; Cancer in her  father and mother; Colon cancer in her paternal grandfather; Colon polyps in her father; Diabetes in her father; Healthy in her sister, sister, and sister; Hypertension in her father.    Allergies  Allergen Reactions  . Epinephrine   . Neomycin Hives  . Other Other (See Comments)    polymycin - blisters  . Penicillins Itching    Previous Medications   CHOLECALCIFEROL (VITAMIN D) 1000 UNITS TABLET    Take 1,000 Units by mouth daily.   MELOXICAM (MOBIC) 15 MG TABLET    Take 1 tablet by mouth as needed.    Patient Care Team: Margarita Rana, MD as PCP - General (Family Medicine) Robert Bellow, MD (General Surgery) Margarita Rana, MD as Referring Physician (Family Medicine)     Objective:   Vitals: BP 130/80 mmHg  Pulse 68  Temp(Src) 97.9 F (36.6 C) (Oral)  Resp 16  Ht 5\' 5"  (1.651 m)  Wt 153 lb (69.4 kg)  BMI 25.46 kg/m2  SpO2 96%  LMP 12/05/2012   Physical Exam  Constitutional: She is oriented to person, place, and time. She appears well-developed and well-nourished.  HENT:  Head: Normocephalic and atraumatic.  Right Ear: Tympanic membrane, external ear and ear canal normal.  Left Ear: Tympanic membrane, external ear and ear canal normal.  Nose: Nose normal.  Mouth/Throat: Uvula is midline, oropharynx is clear and moist and mucous membranes are normal.  Eyes: Conjunctivae, EOM and lids are normal. Pupils are equal, round, and reactive to light.  Neck: Trachea normal and normal range of motion. Neck supple. Carotid bruit is not present. No thyroid mass and no thyromegaly present.  Cardiovascular: Normal rate, regular rhythm and normal heart sounds.   Pulmonary/Chest: Effort normal and breath sounds normal.  Abdominal: Soft. Normal appearance and bowel sounds are normal. There is no hepatosplenomegaly. There is no tenderness.  Musculoskeletal: Normal range of motion.  Lymphadenopathy:    She has no cervical adenopathy.    She has no axillary adenopathy.    Neurological: She is alert and oriented to person, place, and time. She has normal strength. No cranial nerve deficit.  Skin: Skin is warm, dry and intact.  Psychiatric: She has a normal mood and affect. Her speech is normal and behavior is normal. Judgment and thought content normal. Cognition and memory are normal.     Depression Screen PHQ 2/9 Scores 06/05/2015  PHQ - 2 Score 0      Assessment & Plan:     Routine Health Maintenance and Physical Exam  Exercise Activities and Dietary recommendations Goals    . Exercise 150 minutes per week (moderate activity)       Immunization History  Administered Date(s) Administered  . Td 06/14/2006   1. Annual physical exam As above.    2. Elevated liver enzymes Recheck labs. ALP was elevated, iso-enzymes normal. Will monitor.   - CBC with Differential/Platelet - Comprehensive metabolic panel - TSH  3. Hypercholesterolemia Stabe  - Lipid Panel With LDL/HDL Ratio  4. Avitaminosis D Stable.  - VITAMIN D 25 Hydroxy (Vit-D Deficiency, Fractures)  5. Arthritis Will check labs.  Unclear if  Osteoarthritis or rheumatoid. Will also check Xray. Encouraged continued walking for exercise.    - CYCLIC CITRUL PEPTIDE ANTIBODY, IGG/IGA - ANA - Sedimentation rate - Rheumatoid factor - DG Lumbar Spine Complete; Future  6. Lateral epicondylitis, left Anti-inflammatories, ice and rub. No lifting. Patient instructed to call back if condition worsens or does not improve.       Patient was seen and examined by Jerrell Belfast, MD, and note scribed by Lynford Humphrey, [place].    I have reviewed the document for accuracy and completeness and I agree with above. Jerrell Belfast, MD   Margarita Rana, MD    --------------------------------------------------------------------

## 2015-06-06 ENCOUNTER — Telehealth: Payer: Self-pay

## 2015-06-06 NOTE — Telephone Encounter (Signed)
Pt informed and voiced understanding of results. 

## 2015-06-06 NOTE — Telephone Encounter (Signed)
LMTCB 06/06/2015  Thanks,   -Mickel Baas

## 2015-06-06 NOTE — Telephone Encounter (Signed)
-----   Message from Margarita Rana, MD sent at 06/05/2015  5:04 PM EST ----- Does have degenerative disc disease and facet joint changes consistent with osteoarthritis. Further plan pending labs.  Thanks.

## 2015-06-12 ENCOUNTER — Telehealth: Payer: Self-pay

## 2015-06-12 LAB — COMPREHENSIVE METABOLIC PANEL
A/G RATIO: 1.7 (ref 1.1–2.5)
ALT: 13 IU/L (ref 0–32)
AST: 17 IU/L (ref 0–40)
Albumin: 4.7 g/dL (ref 3.5–5.5)
Alkaline Phosphatase: 151 IU/L — ABNORMAL HIGH (ref 39–117)
BUN/Creatinine Ratio: 23 (ref 9–23)
BUN: 15 mg/dL (ref 6–24)
Bilirubin Total: 0.4 mg/dL (ref 0.0–1.2)
CALCIUM: 9.8 mg/dL (ref 8.7–10.2)
CO2: 25 mmol/L (ref 18–29)
CREATININE: 0.66 mg/dL (ref 0.57–1.00)
Chloride: 102 mmol/L (ref 96–106)
GFR, EST AFRICAN AMERICAN: 116 mL/min/{1.73_m2} (ref >59)
GFR, EST NON AFRICAN AMERICAN: 100 mL/min/{1.73_m2} (ref >59)
Globulin, Total: 2.7 g/dL (ref 1.5–4.5)
Glucose: 86 mg/dL (ref 65–99)
POTASSIUM: 4.5 mmol/L (ref 3.5–5.2)
Sodium: 145 mmol/L — ABNORMAL HIGH (ref 134–144)
TOTAL PROTEIN: 7.4 g/dL (ref 6.0–8.5)

## 2015-06-12 LAB — CBC WITH DIFFERENTIAL/PLATELET
Basophils Absolute: 0 10*3/uL (ref 0.0–0.2)
Basos: 1 %
EOS (ABSOLUTE): 0.1 10*3/uL (ref 0.0–0.4)
EOS: 3 %
Hematocrit: 40.5 % (ref 34.0–46.6)
Hemoglobin: 13.1 g/dL (ref 11.1–15.9)
IMMATURE GRANS (ABS): 0 10*3/uL (ref 0.0–0.1)
IMMATURE GRANULOCYTES: 1 %
LYMPHS: 33 %
Lymphocytes Absolute: 1.4 10*3/uL (ref 0.7–3.1)
MCH: 28.2 pg (ref 26.6–33.0)
MCHC: 32.3 g/dL (ref 31.5–35.7)
MCV: 87 fL (ref 79–97)
Monocytes Absolute: 0.3 10*3/uL (ref 0.1–0.9)
Monocytes: 7 %
NEUTROS PCT: 55 %
Neutrophils Absolute: 2.4 10*3/uL (ref 1.4–7.0)
PLATELETS: 301 10*3/uL (ref 150–379)
RBC: 4.64 x10E6/uL (ref 3.77–5.28)
RDW: 14.3 % (ref 12.3–15.4)
WBC: 4.3 10*3/uL (ref 3.4–10.8)

## 2015-06-12 LAB — TSH: TSH: 0.851 u[IU]/mL (ref 0.450–4.500)

## 2015-06-12 LAB — CYCLIC CITRUL PEPTIDE ANTIBODY, IGG/IGA: Cyclic Citrullin Peptide Ab: 12 units (ref 0–19)

## 2015-06-12 LAB — LIPID PANEL WITH LDL/HDL RATIO
Cholesterol, Total: 255 mg/dL — ABNORMAL HIGH (ref 100–199)
HDL: 77 mg/dL (ref >39)
LDL CALC: 156 mg/dL — AB (ref 0–99)
LDl/HDL Ratio: 2 ratio units (ref 0.0–3.2)
Triglycerides: 111 mg/dL (ref 0–149)
VLDL Cholesterol Cal: 22 mg/dL (ref 5–40)

## 2015-06-12 LAB — SEDIMENTATION RATE: Sed Rate: 3 mm/hr (ref 0–40)

## 2015-06-12 LAB — ANA: ANA: NEGATIVE

## 2015-06-12 LAB — VITAMIN D 25 HYDROXY (VIT D DEFICIENCY, FRACTURES): Vit D, 25-Hydroxy: 28.4 ng/mL — ABNORMAL LOW (ref 30.0–100.0)

## 2015-06-12 LAB — RHEUMATOID FACTOR: Rhuematoid fact SerPl-aCnc: <10 IU/mL (ref 0.0–13.9)

## 2015-06-12 NOTE — Telephone Encounter (Signed)
-----  Message from Nancy Maloney, MD sent at 06/12/2015  6:47 AM EST ----- Labs stable.  Please add Alk  Phos Isoenzymes if possible. ALso  Cholesterol elevated at 255 but has such high good cholesterol 10 year risk of heart disease is only 2 percent and medication is not recommended.   Vit is slightly below normal. Please clarify if taking any Vit D supplement. Markers for arthritis show no inflammatory arthritis.  Thanks. 

## 2015-06-12 NOTE — Telephone Encounter (Signed)
Talked with patient. Will treat symptoms. Stay active.  Call if worsens or does not improve.  Thanks.

## 2015-06-12 NOTE — Telephone Encounter (Signed)
[place] to add on test as below. Left message for Ms. Deroo to return my call for lab results. Renaldo Fiddler, CMA

## 2015-06-12 NOTE — Telephone Encounter (Signed)
Advised pt of lab results. Pt verbally acknowledges understanding. Pt would like to know what to do about the OA and DDD that was found in her spine x ray. Please advise. Renaldo Fiddler, CMA

## 2015-06-25 ENCOUNTER — Ambulatory Visit (INDEPENDENT_AMBULATORY_CARE_PROVIDER_SITE_OTHER): Payer: Managed Care, Other (non HMO) | Admitting: General Surgery

## 2015-06-25 ENCOUNTER — Encounter: Payer: Self-pay | Admitting: General Surgery

## 2015-06-25 VITALS — BP 124/80 | HR 74 | Resp 12 | Ht 65.0 in | Wt 152.0 lb

## 2015-06-25 DIAGNOSIS — Z853 Personal history of malignant neoplasm of breast: Secondary | ICD-10-CM | POA: Diagnosis not present

## 2015-06-25 NOTE — Patient Instructions (Addendum)
Patient to return for a follow up in one year.

## 2015-06-25 NOTE — Progress Notes (Signed)
Patient ID: Donna Hoffman, female   DOB: August 31, 1960, 55 y.o.   MRN: [id number]  Chief Complaint  Patient presents with  . Other    breast cancer follow up    HPI Donna Hoffman is a 55 y.o. female here today for her one year breast cancer check. Patient states she is doing well. She did notice over the summer she had a small white knot developed that lasted one day on the right mastectomy incision, no trouble since.  I reviewed the patient's history.   HPI  Past Medical History  Diagnosis Date  . Heart murmur   . GERD (gastroesophageal reflux disease)   . 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  Plan adjuvant chemotherapy.  . Malignant neoplasm of breast (female), unspecified site 05/11/2010    Invasive ductal carcinoma, extensive DCIS. BRCA negative    Past Surgical History  Procedure Laterality Date  . Dilation and curettage of uterus    . Upper gi endoscopy    . Mastectomy Bilateral 2011,2012  . Colonoscopy  2012  . Abdominal hysterectomy  05/04/13    total hysterectomy  . Colonoscopy  03/09/2011    Normal exam  . Breast surgery Left 05/27/2011    Left simple mastectomy for benign disease.  . Breast surgery Right 05/28/2010    Right simple mastectomy/sentinel node biopsy    Family History  Problem Relation Age of Onset  . Breast cancer Mother     x2  . Arthritis Mother   . Cancer Mother     breast  . Colon cancer Paternal Grandfather   . Colon polyps Father   . Diabetes Father   . Hypertension Father   . Cancer Father     skin  . Healthy Sister   . Healthy Sister   . Healthy Sister     Social History Social History  Substance Use Topics  . Smoking status: Former Smoker -- 5 years  . Smokeless tobacco: Never Used  . Alcohol Use: No    Allergies  Allergen Reactions  . Epinephrine   . Neomycin Hives  . Other Other (See Comments)    polymycin - blisters  .  Penicillins Itching    Current Outpatient Prescriptions  Medication Sig Dispense Refill  . cholecalciferol (VITAMIN D) 1000 UNITS tablet Take 1,000 Units by mouth daily.    . meloxicam (MOBIC) 15 MG tablet Take 1 tablet by mouth as needed.     No current facility-administered medications for this visit.    Review of Systems Review of Systems  Constitutional: Negative.   Respiratory: Negative.   Cardiovascular: Negative.     Blood pressure 124/80, pulse 74, resp. rate 12, height '5\' 5"'$  (1.651 m), weight 152 lb (68.947 kg), last menstrual period 12/05/2012.  Physical Exam Physical Exam  Constitutional: She is oriented to person, place, and time. She appears well-developed and well-nourished.  HENT:  Mouth/Throat: Oropharynx is clear and moist.  Eyes: Conjunctivae are normal. No scleral icterus.  Neck: Neck supple.  Cardiovascular: Normal rate, regular rhythm and normal heart sounds.   Pulmonary/Chest: Effort normal and breath sounds normal.    Bilateral chest incisions well healed.  Lymphadenopathy:    She has no cervical adenopathy.  Neurological: She is alert and oriented to person, place, and time.  Skin: Skin is warm.  Psychiatric: Her behavior is normal.    Data Reviewed None.  Assessment  Doing well now 5 years status post right mastectomy for a T1c hormone receptor negative, HER-2/neu amplified malignancy treated by mastectomy without adjuvant chemotherapy. Subsequent contralateral reflected mastectomy.    Plan    New prescription provided for breast prosthesis and surgical bras.  The opportunity to defer routine surgical follow-up was offered but declined.     Patient to return for a follow up in one year.   PCP:  Margarita Rana This information has been scribed by Karie Fetch [place].  Robert Bellow 06/25/2015, 8:33 PM

## 2015-07-02 LAB — ALKALINE PHOSPHATASE, ISOENZYMES
ALK PHOS: 146 IU/L — AB (ref 39–117)
BONE FRACTION: 42 % (ref 14–68)
INTESTINAL FRAC.: 7 % (ref 0–18)
LIVER FRACTION: 51 % (ref 18–85)

## 2015-07-02 LAB — SPECIMEN STATUS REPORT

## 2015-07-09 ENCOUNTER — Other Ambulatory Visit: Payer: Self-pay | Admitting: *Deleted

## 2015-07-09 DIAGNOSIS — C50919 Malignant neoplasm of unspecified site of unspecified female breast: Secondary | ICD-10-CM

## 2015-07-11 ENCOUNTER — Inpatient Hospital Stay: Payer: Managed Care, Other (non HMO) | Attending: Oncology

## 2015-07-11 ENCOUNTER — Inpatient Hospital Stay (HOSPITAL_BASED_OUTPATIENT_CLINIC_OR_DEPARTMENT_OTHER): Payer: Managed Care, Other (non HMO) | Admitting: Oncology

## 2015-07-11 VITALS — BP 123/80 | HR 61 | Temp 97.8°F | Resp 16 | Wt 151.5 lb

## 2015-07-11 DIAGNOSIS — Z87891 Personal history of nicotine dependence: Secondary | ICD-10-CM | POA: Diagnosis not present

## 2015-07-11 DIAGNOSIS — C50911 Malignant neoplasm of unspecified site of right female breast: Secondary | ICD-10-CM

## 2015-07-11 DIAGNOSIS — R011 Cardiac murmur, unspecified: Secondary | ICD-10-CM | POA: Insufficient documentation

## 2015-07-11 DIAGNOSIS — Z803 Family history of malignant neoplasm of breast: Secondary | ICD-10-CM | POA: Insufficient documentation

## 2015-07-11 DIAGNOSIS — Z800 Family history of malignant neoplasm of digestive organs: Secondary | ICD-10-CM | POA: Diagnosis not present

## 2015-07-11 DIAGNOSIS — Z9013 Acquired absence of bilateral breasts and nipples: Secondary | ICD-10-CM | POA: Insufficient documentation

## 2015-07-11 DIAGNOSIS — Z853 Personal history of malignant neoplasm of breast: Secondary | ICD-10-CM | POA: Diagnosis not present

## 2015-07-11 DIAGNOSIS — C50919 Malignant neoplasm of unspecified site of unspecified female breast: Secondary | ICD-10-CM

## 2015-07-11 NOTE — Progress Notes (Signed)
[facility]  [phone] [phone]) [phone]  ID: Donna Hoffman OB: October 08, 1960  MR#: [id number]  TOT#:[id number]  Patient Care Team: Lorie Phenix, MD as PCP - General (Family Medicine) Earline Mayotte, MD (General Surgery) Lorie Phenix, MD as Referring Physician (Family Medicine)  CHIEF COMPLAINT:  Chief Complaint  Patient presents with  . Breast Cancer    INTERVAL HISTORY: Patient returns to clinic for routine yearly evaluation.  She continues to feel well and is asymptomatic. She denies any pain.  She has no neurologic complaints.  She has no chest pain or shortness of breath.  She has a good appetite and has maintained her weight.  She denies any nausea, vomiting, constipation, or diarrhea.  She has no urinary complaints.  Patient offers no specific complaints today.  REVIEW OF SYSTEMS:   Review of Systems  Constitutional: Negative.  Negative for fever, weight loss and malaise/fatigue.  Respiratory: Negative.   Cardiovascular: Negative.   Gastrointestinal: Negative.   Musculoskeletal: Negative.   Neurological: Negative.     As per HPI. Otherwise, a complete review of systems is negatve.  PAST MEDICAL HISTORY: Past Medical History  Diagnosis Date  . Heart murmur   . GERD (gastroesophageal reflux disease)   . 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  Plan adjuvant chemotherapy.  . Malignant neoplasm of breast (female), unspecified site 05/11/2010    Invasive ductal carcinoma, extensive DCIS. BRCA negative    PAST SURGICAL HISTORY: Past Surgical History  Procedure Laterality Date  . Dilation and curettage of uterus    . Upper gi endoscopy    . Mastectomy Bilateral 2011,2012  . Colonoscopy  2012  . Abdominal hysterectomy  05/04/13    total hysterectomy  . Colonoscopy  03/09/2011    Normal exam  . Breast surgery Left 05/27/2011    Left simple  mastectomy for benign disease.  . Breast surgery Right 05/28/2010    Right simple mastectomy/sentinel node biopsy    FAMILY HISTORY Family History  Problem Relation Age of Onset  . Breast cancer Mother     x2  . Arthritis Mother   . Cancer Mother     breast  . Colon cancer Paternal Grandfather   . Colon polyps Father   . Diabetes Father   . Hypertension Father   . Cancer Father     skin  . Healthy Sister   . Healthy Sister   . Healthy Sister        ADVANCED DIRECTIVES:    HEALTH MAINTENANCE: Social History  Substance Use Topics  . Smoking status: Former Smoker -- 5 years  . Smokeless tobacco: Never Used  . Alcohol Use: No     Colonoscopy:  PAP:  Bone density:  Lipid panel:  Allergies  Allergen Reactions  . Epinephrine   . Neomycin Hives  . Other Other (See Comments)    polymycin - blisters  . Penicillins Itching    Current Outpatient Prescriptions  Medication Sig Dispense Refill  . cholecalciferol (VITAMIN D) 1000 UNITS tablet Take 1,000 Units by mouth daily.    . meloxicam (MOBIC) 15 MG tablet Take 1 tablet by mouth as needed.     No current facility-administered medications for this visit.    OBJECTIVE: Filed Vitals:   07/11/15 1130  BP: 123/80  Pulse: 61  Temp: 97.8 F (36.6 C)  Resp: 16     Body mass index  is 25.2 kg/(m^2).    ECOG FS:0 - Asymptomatic  General: Well-developed, well-nourished, no acute distress. Eyes: Pink conjunctiva, anicteric sclera. Breasts: Bilateral mastectomy. Exam deferred today. Lungs: Clear to auscultation bilaterally. Heart: Regular rate and rhythm. No rubs, murmurs, or gallops. Abdomen: Soft, nontender, nondistended. No organomegaly noted, normoactive bowel sounds. Musculoskeletal: No edema, cyanosis, or clubbing. Neuro: Alert, answering all questions appropriately. Cranial nerves grossly intact. Skin: No rashes or petechiae noted. Psych: Normal affect.  LAB RESULTS:  Lab Results  Component Value Date    NA 145* 06/10/2015   K 4.5 06/10/2015   CL 102 06/10/2015   CO2 25 06/10/2015   GLUCOSE 86 06/10/2015   BUN 15 06/10/2015   CREATININE 0.66 06/10/2015   CALCIUM 9.8 06/10/2015   PROT 7.4 06/10/2015   ALBUMIN 4.7 06/10/2015   AST 17 06/10/2015   ALT 13 06/10/2015   ALKPHOS 151* 06/10/2015   ALKPHOS 146* 06/10/2015   BILITOT 0.4 06/10/2015   GFRNONAA 100 06/10/2015   GFRAA 116 06/10/2015    Lab Results  Component Value Date   WBC 4.3 06/10/2015   NEUTROABS 2.4 06/10/2015   HGB 11.2* 05/05/2013   HCT 40.5 06/10/2015   MCV 87 06/10/2015   PLT 301 06/10/2015     STUDIES: No results found.  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  PLAN:    1.  Breast cancer: Despite recommendations of adjuvant chemotherapy, patient previously refused.  She expressed understanding that this could increase her risk of recurrence. Patient's final pathology report was resulted in December 2011. She is now greater than 5 years removed from her surgery and no further follow-up has been scheduled. She will continue routine followups with Dr. Bary Castilla and Dr. Venia Minks.  Because she has had bilateral mastectomies, she requires no further mammograms.  Patient expressed understanding and was in agreement with this plan. She also understands that She can call clinic at any time with any questions, concerns, or complaints.   Lloyd Huger, MD   07/11/2015 11:51 AM

## 2015-07-12 LAB — CA 27.29 (SERIAL MONITOR): CA 27.29: 8.6 U/mL (ref 0.0–38.6)

## 2015-09-24 ENCOUNTER — Encounter: Payer: Self-pay | Admitting: Family Medicine

## 2015-09-24 ENCOUNTER — Ambulatory Visit (INDEPENDENT_AMBULATORY_CARE_PROVIDER_SITE_OTHER): Payer: Managed Care, Other (non HMO) | Admitting: Family Medicine

## 2015-09-24 VITALS — BP 102/60 | HR 80 | Temp 98.2°F | Resp 16 | Wt 153.0 lb

## 2015-09-24 DIAGNOSIS — M549 Dorsalgia, unspecified: Secondary | ICD-10-CM | POA: Diagnosis not present

## 2015-09-24 DIAGNOSIS — R748 Abnormal levels of other serum enzymes: Secondary | ICD-10-CM | POA: Insufficient documentation

## 2015-09-24 DIAGNOSIS — R319 Hematuria, unspecified: Secondary | ICD-10-CM

## 2015-09-24 LAB — POCT URINALYSIS DIPSTICK
Bilirubin, UA: NEGATIVE
GLUCOSE UA: NEGATIVE
Ketones, UA: NEGATIVE
NITRITE UA: NEGATIVE
PH UA: 6
PROTEIN UA: NEGATIVE
Spec Grav, UA: >=1.03
UROBILINOGEN UA: 0.2

## 2015-09-24 NOTE — Progress Notes (Signed)
Patient: Donna Hoffman Female    DOB: 1961/02/07   55 y.o.   MRN: [id number] Visit Date: 09/24/2015  Today's Provider: Margarita Rana, MD   Chief Complaint  Patient presents with  . Back Pain   Subjective:    Back Pain This is a new problem. Episode onset: several weeks. The problem occurs intermittently. The problem has been gradually worsening since onset. The pain is present in the thoracic spine (lower right side. Ciomes and goes.  ). Quality: sometimes sharp pain. Just ache in one spot.   Last 30 to 45 seconds.  The pain does not radiate. The pain is at a severity of 4/10 (When it happens.  Makes you pay attention to it.  Feels internal. ). The pain is moderate. The pain is the same all the time. Exacerbated by: Nothiing. Pertinent negatives include no abdominal pain, bladder incontinence, bowel incontinence, chest pain, dysuria, fever, headaches, leg pain, numbness, paresis, paresthesias, pelvic pain, perianal numbness, tingling, weakness or weight loss. (Just once time today.  Different from previous back pain.  ) Risk factors include history of cancer. Treatments tried: Occasional Meloxicam. Has not made a difference.  The treatment provided mild relief.   Dad has stone and then decreased renal function from obstruction and is concerned about that. Has had breast cancer and has been recently cleared by Oncology.      Allergies  Allergen Reactions  . Epinephrine   . Neomycin Hives  . Other Other (See Comments)    polymycin - blisters  . Penicillins Itching   Previous Medications   CHOLECALCIFEROL (VITAMIN D) 1000 UNITS TABLET    Take 1,000 Units by mouth daily.   MELOXICAM (MOBIC) 15 MG TABLET    Take 1 tablet by mouth as needed.    Review of Systems  Constitutional: Negative for fever, chills, weight loss, appetite change and fatigue.  Respiratory: Negative for chest tightness and shortness of breath.   Cardiovascular: Negative for chest pain and palpitations.    Gastrointestinal: Negative for nausea, vomiting, abdominal pain and bowel incontinence.  Genitourinary: Negative for bladder incontinence, dysuria and pelvic pain.  Musculoskeletal: Positive for back pain.  Neurological: Negative for dizziness, tingling, weakness, numbness, headaches and paresthesias.    Social History  Substance Use Topics  . Smoking status: Former Smoker -- 5 years  . Smokeless tobacco: Never Used  . Alcohol Use: No   Objective:   BP 102/60 mmHg  Pulse 80  Temp(Src) 98.2 F (36.8 C) (Oral)  Resp 16  Wt 153 lb (69.4 kg)  SpO2 96%  LMP 12/05/2012  Physical Exam  Constitutional: She is oriented to person, place, and time. She appears well-developed and well-nourished.  Cardiovascular: Normal rate and regular rhythm.   Pulmonary/Chest: Effort normal and breath sounds normal.  Abdominal: Soft. Bowel sounds are normal. There is no tenderness. There is no rebound.  Neurological: She is alert and oriented to person, place, and time.      Assessment & Plan:     1. Mid back pain on right side New problem.  Does have hematuria.   Will check non-contrast CT.  Further plan pending these results.  - POCT urinalysis dipstick - CT Abdomen Pelvis Wo Contrast; Future Results for orders placed or performed in visit on 09/24/15  POCT urinalysis dipstick  Result Value Ref Range   Color, UA amber    Clarity, UA clear    Glucose, UA negative    Bilirubin, UA negative  Ketones, UA negative    Spec Grav, UA >=1.030    Blood, UA Trace (non hemolyzed)    pH, UA 6.0    Protein, UA negative    Urobilinogen, UA 0.2    Nitrite, UA negative    Leukocytes, UA Trace (A) Negative    2. Hematuria As above.  - CT Abdomen Pelvis Wo Contrast; Future  3. Elevated liver enzymes Stable.  Will recheck.   - Comprehensive Metabolic Panel (CMET) - Gamma GT     Margarita Rana, MD  [facility]

## 2015-09-25 ENCOUNTER — Telehealth: Payer: Self-pay

## 2015-09-25 LAB — COMPREHENSIVE METABOLIC PANEL
ALT: 9 IU/L (ref 0–32)
AST: 13 IU/L (ref 0–40)
Albumin/Globulin Ratio: 1.8 (ref 1.2–2.2)
Albumin: 4.4 g/dL (ref 3.5–5.5)
Alkaline Phosphatase: 145 IU/L — ABNORMAL HIGH (ref 39–117)
BUN/Creatinine Ratio: 19 (ref 9–23)
BUN: 14 mg/dL (ref 6–24)
Bilirubin Total: 0.5 mg/dL (ref 0.0–1.2)
CALCIUM: 10.3 mg/dL — AB (ref 8.7–10.2)
CO2: 28 mmol/L (ref 18–29)
CREATININE: 0.72 mg/dL (ref 0.57–1.00)
Chloride: 101 mmol/L (ref 96–106)
GFR calc Af Amer: 109 mL/min/{1.73_m2} (ref >59)
GFR, EST NON AFRICAN AMERICAN: 95 mL/min/{1.73_m2} (ref >59)
Globulin, Total: 2.4 g/dL (ref 1.5–4.5)
Glucose: 76 mg/dL (ref 65–99)
Potassium: 4.5 mmol/L (ref 3.5–5.2)
SODIUM: 142 mmol/L (ref 134–144)
TOTAL PROTEIN: 6.8 g/dL (ref 6.0–8.5)

## 2015-09-25 LAB — GAMMA GT: GGT: 13 IU/L (ref 0–60)

## 2015-09-25 NOTE — Telephone Encounter (Signed)
Patient is returning your call. KW 

## 2015-09-25 NOTE — Telephone Encounter (Signed)
LMTCB 09/25/2015  Thanks,   -Mickel Baas

## 2015-09-25 NOTE — Telephone Encounter (Signed)
-----   Message from Margarita Rana, MD sent at 09/25/2015  7:15 AM EDT ----- Labs stable except for very mildly elevated calcium.  Stay well hydrated and recheck in 4 weeks. Thanks.

## 2015-09-26 ENCOUNTER — Ambulatory Visit
Admission: RE | Admit: 2015-09-26 | Discharge: 2015-09-26 | Disposition: A | Discharge status: Home / Self Care | Payer: Managed Care, Other (non HMO) | Source: Ambulatory Visit | Attending: Family Medicine | Admitting: Family Medicine

## 2015-09-26 ENCOUNTER — Telehealth: Payer: Self-pay

## 2015-09-26 DIAGNOSIS — R319 Hematuria, unspecified: Secondary | ICD-10-CM | POA: Diagnosis present

## 2015-09-26 DIAGNOSIS — Z9013 Acquired absence of bilateral breasts and nipples: Secondary | ICD-10-CM | POA: Insufficient documentation

## 2015-09-26 DIAGNOSIS — Z90710 Acquired absence of both cervix and uterus: Secondary | ICD-10-CM | POA: Diagnosis not present

## 2015-09-26 DIAGNOSIS — R195 Other fecal abnormalities: Secondary | ICD-10-CM | POA: Diagnosis not present

## 2015-09-26 DIAGNOSIS — N200 Calculus of kidney: Secondary | ICD-10-CM | POA: Diagnosis not present

## 2015-09-26 DIAGNOSIS — M549 Dorsalgia, unspecified: Secondary | ICD-10-CM

## 2015-09-26 DIAGNOSIS — M419 Scoliosis, unspecified: Secondary | ICD-10-CM | POA: Diagnosis not present

## 2015-09-26 NOTE — Telephone Encounter (Signed)
Advised pt of lab results. Pt verbally acknowledges understanding. Emily Drozdowski, CMA   

## 2015-09-26 NOTE — Telephone Encounter (Signed)
-----   Message from Margarita Rana, MD sent at 09/26/2015  2:41 PM EDT ----- Does have 1 mm  Stone on right side. May be cause of pain. Not obstructing and should pass on it's own. Drink a lot of fluid.  Thanks.

## 2015-09-26 NOTE — Telephone Encounter (Signed)
Pt advised as directed below.   Thanks,   -Addilynn Mowrer  

## 2015-11-26 ENCOUNTER — Telehealth: Payer: Self-pay | Admitting: Family Medicine

## 2015-11-26 NOTE — Telephone Encounter (Signed)
Pt called needing to pick up a lab slip to have her liver enzymes rechecked.  Her call back is [phone]  Thanks Con Memos

## 2015-11-26 NOTE — Addendum Note (Signed)
Addended by: Silvio Clayman on: 11/26/2015 03:35 PM   Modules accepted: Orders

## 2015-11-26 NOTE — Telephone Encounter (Signed)
Does pt just need Met. C? Renaldo Fiddler, CMA

## 2015-11-26 NOTE — Telephone Encounter (Signed)
Printed slip and informed pt. Renaldo Fiddler, CMA

## 2015-11-26 NOTE — Telephone Encounter (Signed)
Ok to order. Thanks.   

## 2015-12-02 ENCOUNTER — Other Ambulatory Visit: Payer: Self-pay | Admitting: Family Medicine

## 2015-12-03 LAB — SPECIMEN STATUS REPORT

## 2015-12-05 LAB — COMPREHENSIVE METABOLIC PANEL
ALBUMIN: 4.6 g/dL (ref 3.5–5.5)
ALK PHOS: 130 IU/L — AB (ref 39–117)
ALT: 16 IU/L (ref 0–32)
AST: 21 IU/L (ref 0–40)
Albumin/Globulin Ratio: 1.6 (ref 1.2–2.2)
BUN / CREAT RATIO: 24 — AB (ref 9–23)
BUN: 17 mg/dL (ref 6–24)
Bilirubin Total: <0.2 mg/dL (ref 0.0–1.2)
CO2: 14 mmol/L — AB (ref 18–29)
CREATININE: 0.7 mg/dL (ref 0.57–1.00)
Calcium: 9.4 mg/dL (ref 8.7–10.2)
Chloride: 104 mmol/L (ref 96–106)
GFR calc non Af Amer: 98 mL/min/{1.73_m2} (ref >59)
GFR, EST AFRICAN AMERICAN: 113 mL/min/{1.73_m2} (ref >59)
GLUCOSE: 80 mg/dL (ref 65–99)
Globulin, Total: 2.8 g/dL (ref 1.5–4.5)
Potassium: 4.4 mmol/L (ref 3.5–5.2)
Sodium: 154 mmol/L — ABNORMAL HIGH (ref 134–144)
TOTAL PROTEIN: 7.4 g/dL (ref 6.0–8.5)

## 2015-12-08 ENCOUNTER — Telehealth: Payer: Self-pay

## 2015-12-08 DIAGNOSIS — E870 Hyperosmolality and hypernatremia: Secondary | ICD-10-CM

## 2015-12-08 DIAGNOSIS — R748 Abnormal levels of other serum enzymes: Secondary | ICD-10-CM

## 2015-12-08 NOTE — Telephone Encounter (Signed)
-----   Message from Margarita Rana, MD sent at 12/06/2015  8:55 AM EDT ----- Sodium elevated.  Suspect lab error. Recheck labs on Monday, at different drawing station. Thanks.

## 2015-12-08 NOTE — Telephone Encounter (Signed)
LMTCB 12/08/2015  Thanks,   -Mickel Baas

## 2015-12-09 NOTE — Telephone Encounter (Signed)
Referred to GI per pt request. Renaldo Fiddler, CMA

## 2015-12-09 NOTE — Telephone Encounter (Signed)
Pt will get labs drawn today, Tuesday. Pt asking about elevated alk phos, and does this need to be worked up? Renaldo Fiddler, CMA

## 2015-12-09 NOTE — Telephone Encounter (Signed)
Ratio was ok when last checked, but can send to GI to make sure not missing a liver etiology. Thanks.

## 2015-12-10 ENCOUNTER — Telehealth: Payer: Self-pay

## 2015-12-10 LAB — COMPREHENSIVE METABOLIC PANEL
ALBUMIN: 4.5 g/dL (ref 3.5–5.5)
ALK PHOS: 142 IU/L — AB (ref 39–117)
ALT: 8 IU/L (ref 0–32)
AST: 13 IU/L (ref 0–40)
Albumin/Globulin Ratio: 1.9 (ref 1.2–2.2)
BUN / CREAT RATIO: 23 (ref 9–23)
BUN: 16 mg/dL (ref 6–24)
Bilirubin Total: 0.4 mg/dL (ref 0.0–1.2)
CALCIUM: 10 mg/dL (ref 8.7–10.2)
CO2: 26 mmol/L (ref 18–29)
CREATININE: 0.71 mg/dL (ref 0.57–1.00)
Chloride: 102 mmol/L (ref 96–106)
GFR calc Af Amer: 111 mL/min/{1.73_m2} (ref >59)
GFR calc non Af Amer: 96 mL/min/{1.73_m2} (ref >59)
GLOBULIN, TOTAL: 2.4 g/dL (ref 1.5–4.5)
Glucose: 79 mg/dL (ref 65–99)
Potassium: 4.9 mmol/L (ref 3.5–5.2)
Sodium: 144 mmol/L (ref 134–144)
Total Protein: 6.9 g/dL (ref 6.0–8.5)

## 2015-12-10 NOTE — Telephone Encounter (Signed)
-----  Message from Margarita Rana, MD sent at 12/10/2015  9:32 AM EDT ----- Labs stable. Alk Phos still mildly elevated. Proceed with GI referral. Already placed. Thanks.

## 2015-12-10 NOTE — Telephone Encounter (Signed)
Patient advised as below. Patient reports that she has scheduled an appt with Dr. Allen Norris. sd

## 2015-12-10 NOTE — Telephone Encounter (Signed)
LMTCB 12/10/2015  Thanks,   -Mickel Baas

## 2015-12-10 NOTE — Telephone Encounter (Signed)
Pt is returning call.  CB#[phone]/MW

## 2016-01-05 ENCOUNTER — Encounter: Payer: Self-pay | Admitting: Gastroenterology

## 2016-01-05 ENCOUNTER — Ambulatory Visit (INDEPENDENT_AMBULATORY_CARE_PROVIDER_SITE_OTHER): Payer: Managed Care, Other (non HMO) | Admitting: Gastroenterology

## 2016-01-05 VITALS — BP 130/61 | HR 74 | Temp 98.8°F | Ht 65.0 in | Wt 153.0 lb

## 2016-01-05 DIAGNOSIS — R748 Abnormal levels of other serum enzymes: Secondary | ICD-10-CM

## 2016-01-05 NOTE — Progress Notes (Signed)
Gastroenterology Consultation  Referring Provider:     Margarita Rana, MD Primary Care Physician:  Margarita Rana, MD Primary Gastroenterologist:  Dr. Allen Norris     Reason for Consultation:     Elevated alkaline phosphatase        HPI:   Donna Hoffman is a 55 y.o. y/o female referred for consultation & management of Elevated alkaline phosphatase by Dr. Margarita Rana, MD.  This patient comes today with a chronic elevation of her alkaline phosphatase.  The patient was being seen by Dr. Venia Minks and had multiple blood tests that showed her to have an isolated increased alkaline phosphatase.  The patient's bilirubin and liver enzymes were all normal.  The patient has a history of breast cancer with surgery but refused chemotherapy and radiation at that time.  In the past the patient has had a workup which included a GGT and fractionation of the alkaline phosphatase.  The GGT was normal and the fractionation of the alkaline phosphatase showed it to be 62% from bone and only 20% from the liver.  Past Medical History:  Diagnosis Date  . 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  Plan adjuvant chemotherapy.  Marland Kitchen GERD (gastroesophageal reflux disease)   . Heart murmur   . Malignant neoplasm of breast (female), unspecified site 05/11/2010   Invasive ductal carcinoma, extensive DCIS. BRCA negative    Past Surgical History:  Procedure Laterality Date  . ABDOMINAL HYSTERECTOMY  05/04/13   total hysterectomy  . BREAST SURGERY Left 05/27/2011   Left simple mastectomy for benign disease.  Marland Kitchen BREAST SURGERY Right 05/28/2010   Right simple mastectomy/sentinel node biopsy  . COLONOSCOPY  2012  . COLONOSCOPY  03/09/2011   Normal exam  . DILATION AND CURETTAGE OF UTERUS    . MASTECTOMY Bilateral 2011,2012  . UPPER GI ENDOSCOPY      Prior to Admission medications   Medication Sig Start Date End Date Taking? Authorizing  Provider  cholecalciferol (VITAMIN D) 1000 UNITS tablet Take 1,000 Units by mouth daily.   Yes Historical Provider, MD  meloxicam (MOBIC) 15 MG tablet Take 1 tablet by mouth as needed. 11/13/12  Yes Historical Provider, MD    Family History  Problem Relation Age of Onset  . Breast cancer Mother     x2  . Arthritis Mother   . Cancer Mother     breast  . Colon polyps Father   . Diabetes Father   . Hypertension Father   . Cancer Father     skin  . Healthy Sister   . Healthy Sister   . Healthy Sister   . Colon cancer Paternal Grandfather      Social History  Substance Use Topics  . Smoking status: Former Smoker    Years: 5.00  . Smokeless tobacco: Never Used  . Alcohol use No    Allergies as of 01/05/2016 - Review Complete 01/05/2016  Allergen Reaction Noted  . Epinephrine  06/05/2015  . Neomycin Hives 12/12/2012  . Other Other (See Comments) 12/25/2012  . Penicillins Itching 12/12/2012    Review of Systems:    All systems reviewed and negative except where noted in HPI.   Physical Exam:  BP 130/61   Pulse 74   Temp 98.8 F (37.1 C) (Oral)   Ht _0  (1.651 m)   Wt 153 lb (69.4 kg)   LMP 12/05/2012   BMI 25.46 kg/m  Patient's last menstrual period was 12/05/2012. Psych:  Alert and cooperative. Normal mood and affect. General:   Alert,  Well-developed, well-nourished, pleasant and cooperative in NAD Head:  Normocephalic and atraumatic. Eyes:  Sclera clear, no icterus.   Conjunctiva pink. Ears:  Normal auditory acuity. Nose:  No deformity, discharge, or lesions. Abdomen:  Normal bowel sounds.  No bruits.  Soft, non-tender and non-distended without masses, hepatosplenomegaly or hernias noted.  No guarding or rebound tenderness.  Negative Carnett sign.   Rectal:  Deferred.  Msk:  Symmetrical without gross deformities.  Good, equal movement & strength bilaterally. Pulses:  Normal pulses noted. Extremities:  No clubbing or edema.  No cyanosis. Neurologic:  Alert  and oriented x3;  grossly normal neurologically. Skin:  Intact without significant lesions or rashes.  No jaundice. Psych:  Alert and cooperative. Normal mood and affect.  Imaging Studies: No results found.  Assessment and Plan:   Donna Hoffman is a 55 y.o. y/o female Who comes in with persistently elevated alkaline phosphatase.  The patient has a normal GGT and the fractionation of the alkaline phosphatase showed the majority of the alkaline phosphatase to be from the bone and not the liver.  The patient has been explained that it is unlikely that the alk phosphatase elevation is from a liver source and with her history of breast cancer I have contacted Dr. Grayland Ormond who will see her for further workup which may include a DEXA scan, bone scan and rechecking her tumor markers.  The patient has been explained the plan and agrees with it.   Note: This dictation was prepared with Dragon dictation along with smaller phrase technology. Any transcriptional errors that result from this process are unintentional.

## 2016-01-07 ENCOUNTER — Other Ambulatory Visit: Payer: Self-pay | Admitting: Oncology

## 2016-01-07 DIAGNOSIS — C50919 Malignant neoplasm of unspecified site of unspecified female breast: Secondary | ICD-10-CM

## 2016-01-20 ENCOUNTER — Encounter
Admission: RE | Admit: 2016-01-20 | Discharge: 2016-01-20 | Disposition: A | Discharge status: Home / Self Care | Payer: Managed Care, Other (non HMO) | Source: Ambulatory Visit | Attending: Oncology | Admitting: Oncology

## 2016-01-20 ENCOUNTER — Inpatient Hospital Stay: Payer: Managed Care, Other (non HMO) | Attending: Oncology

## 2016-01-20 DIAGNOSIS — R748 Abnormal levels of other serum enzymes: Secondary | ICD-10-CM | POA: Diagnosis not present

## 2016-01-20 DIAGNOSIS — Z809 Family history of malignant neoplasm, unspecified: Secondary | ICD-10-CM | POA: Insufficient documentation

## 2016-01-20 DIAGNOSIS — K219 Gastro-esophageal reflux disease without esophagitis: Secondary | ICD-10-CM | POA: Diagnosis not present

## 2016-01-20 DIAGNOSIS — Z803 Family history of malignant neoplasm of breast: Secondary | ICD-10-CM | POA: Diagnosis not present

## 2016-01-20 DIAGNOSIS — Z79899 Other long term (current) drug therapy: Secondary | ICD-10-CM | POA: Insufficient documentation

## 2016-01-20 DIAGNOSIS — R011 Cardiac murmur, unspecified: Secondary | ICD-10-CM | POA: Diagnosis not present

## 2016-01-20 DIAGNOSIS — Z87891 Personal history of nicotine dependence: Secondary | ICD-10-CM | POA: Insufficient documentation

## 2016-01-20 DIAGNOSIS — C50919 Malignant neoplasm of unspecified site of unspecified female breast: Secondary | ICD-10-CM | POA: Diagnosis present

## 2016-01-20 DIAGNOSIS — Z800 Family history of malignant neoplasm of digestive organs: Secondary | ICD-10-CM | POA: Diagnosis not present

## 2016-01-20 DIAGNOSIS — Z853 Personal history of malignant neoplasm of breast: Secondary | ICD-10-CM | POA: Diagnosis not present

## 2016-01-20 DIAGNOSIS — Z9013 Acquired absence of bilateral breasts and nipples: Secondary | ICD-10-CM | POA: Diagnosis not present

## 2016-01-20 MED ORDER — TECHNETIUM TC 99M MEDRONATE IV KIT
22.0000 | PACK | Freq: Once | INTRAVENOUS | Status: AC | PRN
  Administered 2016-01-20: 22 via INTRAVENOUS

## 2016-01-21 LAB — CANCER ANTIGEN 27.29: CA 27.29: 10.3 U/mL (ref 0.0–38.6)

## 2016-01-26 NOTE — Progress Notes (Signed)
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  INTERVAL HISTORY: Patient is referred back to clinic for discussion of her imaging results of concern have an elevated alkaline phosphatase and possible concern for bony metastasis. She continues to feel well and is asymptomatic. She denies any pain.  She has no neurologic complaints.  She has no chest pain or shortness of breath.  She has a good appetite and has maintained her weight.  She denies any nausea, vomiting, constipation, or diarrhea.  She has no urinary complaints.  Patient offers no specific complaints today.  REVIEW OF SYSTEMS:   Review of Systems  Constitutional: Negative.  Negative for fever, malaise/fatigue and weight loss.  Respiratory: Negative.  Negative for cough and shortness of breath.   Cardiovascular: Negative.  Negative for chest pain.  Gastrointestinal: Negative.  Negative for abdominal pain.  Musculoskeletal: Negative.  Negative for back pain, falls and neck pain.  Neurological: Negative.  Negative for weakness.  Psychiatric/Behavioral: Negative.     As per HPI. Otherwise, a complete review of systems is negatve.  PAST MEDICAL HISTORY: Past Medical History:  Diagnosis Date  . 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  Plan adjuvant chemotherapy.  Marland Kitchen GERD (gastroesophageal reflux disease)   . Heart murmur   . Malignant neoplasm of breast (female), unspecified site 05/11/2010   Invasive ductal carcinoma, extensive DCIS. BRCA  negative    PAST SURGICAL HISTORY: Past Surgical History:  Procedure Laterality Date  . ABDOMINAL HYSTERECTOMY  05/04/13   total hysterectomy  . BREAST SURGERY Left 05/27/2011   Left simple mastectomy for benign disease.  Marland Kitchen BREAST SURGERY Right 05/28/2010   Right simple mastectomy/sentinel node biopsy  . COLONOSCOPY  2012  . COLONOSCOPY  03/09/2011   Normal exam  . DILATION AND CURETTAGE OF UTERUS    . MASTECTOMY Bilateral 2011,2012  . UPPER GI ENDOSCOPY      FAMILY HISTORY Family History  Problem Relation Age of Onset  . Breast cancer Mother     x2  . Arthritis Mother   . Cancer Mother     breast  . Colon polyps Father   . Diabetes Father   . Hypertension Father   . Cancer Father     skin  . Healthy Sister   . Healthy Sister   . Healthy Sister   . Colon cancer Paternal Grandfather        ADVANCED DIRECTIVES:    HEALTH MAINTENANCE: Social History  Substance Use Topics  . Smoking status: Former Smoker    Years: 5.00  . Smokeless tobacco: Never Used  . Alcohol use No     Colonoscopy:  PAP:  Bone density:  Lipid panel:  Allergies  Allergen Reactions  . Epinephrine   . Neomycin Hives  . Other Other (See Comments)    polymycin - blisters  . Penicillins Itching    Current Outpatient Prescriptions  Medication Sig Dispense Refill  . cholecalciferol (VITAMIN D) 1000 UNITS tablet Take 1,000 Units by mouth daily.    . meloxicam (MOBIC) 15 MG tablet Take 1 tablet  by mouth as needed.     No current facility-administered medications for this visit.     OBJECTIVE: Vitals:   01/27/16 1504  BP: 114/72  Pulse: 70  Resp: 18  Temp: 98.4 F (36.9 C)     Body mass index is 25.42 kg/m.    ECOG FS:0 - Asymptomatic  General: Well-developed, well-nourished, no acute distress. Eyes: Pink conjunctiva, anicteric sclera. Breasts: Bilateral mastectomy. Lungs: Clear to auscultation bilaterally. Heart: Regular rate and rhythm. No rubs, murmurs, or  gallops. Abdomen: Soft, nontender, nondistended. No organomegaly noted, normoactive bowel sounds. Musculoskeletal: No edema, cyanosis, or clubbing. Neuro: Alert, answering all questions appropriately. Cranial nerves grossly intact. Skin: No rashes or petechiae noted. Psych: Normal affect.  LAB RESULTS:  Lab Results  Component Value Date   NA 144 12/09/2015   K 4.9 12/09/2015   CL 102 12/09/2015   CO2 26 12/09/2015   GLUCOSE 79 12/09/2015   BUN 16 12/09/2015   CREATININE 0.71 12/09/2015   CALCIUM 10.0 12/09/2015   PROT 6.9 12/09/2015   ALBUMIN 4.5 12/09/2015   AST 13 12/09/2015   ALT 8 12/09/2015   ALKPHOS 142 (H) 12/09/2015   BILITOT 0.4 12/09/2015   GFRNONAA 96 12/09/2015   GFRAA 111 12/09/2015    Lab Results  Component Value Date   WBC 4.3 06/10/2015   NEUTROABS 2.4 06/10/2015   HGB 11.2 (L) 05/05/2013   HCT 40.5 06/10/2015   MCV 87 06/10/2015   PLT 301 06/10/2015   Lab Results  Component Value Date   LABCA2 10.3 01/20/2016     STUDIES: Nm Bone Scan Whole Body  Result Date: 01/20/2016 CLINICAL DATA:  Breast cancer, low back and LEFT hip pain, history of LEFT hip bursitis and scoliosis, BILATERAL double mastectomy EXAM: NUCLEAR MEDICINE WHOLE BODY BONE SCAN TECHNIQUE: Whole body anterior and posterior images were obtained approximately 3 hours after intravenous injection of radiopharmaceutical. RADIOPHARMACEUTICALS:  22 mCi Technetium-69m MDP IV COMPARISON:  None Radiographic correlation:  09/26/2015 CT abdomen/pelvis FINDINGS: Dextroconvex thoracolumbar scoliosis. Minimal uptake at the shoulders RIGHT greater than LEFT, sternoclavicular joints, at LEFT first costochondral junction, and at the cervical spine, typically degenerative. Increased tracet localization at RIGHT L5-S1, corresponding to significant facet degenerative changes on CT. No additional abnormal osseous tracer accumulation identified. Expected urinary tract and soft tissue distribution of tracer.  IMPRESSION: Dextroconvex thoracolumbar scoliosis. Scattered degenerative type uptake as above. No definite scintigraphic evidence of osseous metastatic disease. Electronically Signed   By: Ulyses Southward M.D.   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  PLAN:    1.  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  She expressed understanding that this could increase her risk of recurrence. Patient's final pathology report was resulted in December 2011. She is now greater than 5 years removed from her bilateral mastectomy. Patient bone scan and CA-27-29 are negative or within normal limits. She has no obvious evidence of metastatic disease. No further follow-up has been scheduled. She will continue routine followups with Dr. Lemar Livings and Dr. Elease Hashimoto.  Because she has had bilateral mastectomies, she requires no further mammograms. 2. Elevated alkaline phosphatase: Unclear etiology. With negative bone scan and normal CA-27-29, this is unlikely related to metastatic disease.  Patient expressed understanding and was in agreement with this plan. She also understands  that She can call clinic at any time with any questions, concerns, or complaints.   Lloyd Huger, MD   02/01/2016 8:05 AM

## 2016-01-27 ENCOUNTER — Inpatient Hospital Stay (HOSPITAL_BASED_OUTPATIENT_CLINIC_OR_DEPARTMENT_OTHER): Payer: Managed Care, Other (non HMO) | Admitting: Oncology

## 2016-01-27 VITALS — BP 114/72 | HR 70 | Temp 98.4°F | Resp 18 | Wt 152.8 lb

## 2016-01-27 DIAGNOSIS — Z800 Family history of malignant neoplasm of digestive organs: Secondary | ICD-10-CM

## 2016-01-27 DIAGNOSIS — R011 Cardiac murmur, unspecified: Secondary | ICD-10-CM | POA: Diagnosis not present

## 2016-01-27 DIAGNOSIS — R748 Abnormal levels of other serum enzymes: Secondary | ICD-10-CM | POA: Diagnosis not present

## 2016-01-27 DIAGNOSIS — K219 Gastro-esophageal reflux disease without esophagitis: Secondary | ICD-10-CM

## 2016-01-27 DIAGNOSIS — C50411 Malignant neoplasm of upper-outer quadrant of right female breast: Secondary | ICD-10-CM

## 2016-01-27 DIAGNOSIS — Z853 Personal history of malignant neoplasm of breast: Secondary | ICD-10-CM

## 2016-01-27 DIAGNOSIS — Z803 Family history of malignant neoplasm of breast: Secondary | ICD-10-CM

## 2016-01-27 DIAGNOSIS — Z9013 Acquired absence of bilateral breasts and nipples: Secondary | ICD-10-CM

## 2016-01-27 DIAGNOSIS — Z809 Family history of malignant neoplasm, unspecified: Secondary | ICD-10-CM

## 2016-01-27 DIAGNOSIS — Z79899 Other long term (current) drug therapy: Secondary | ICD-10-CM

## 2016-01-27 DIAGNOSIS — Z87891 Personal history of nicotine dependence: Secondary | ICD-10-CM

## 2016-01-27 NOTE — Progress Notes (Signed)
Having mild back pain today due to scoliosis. Otherwise feeling well.

## 2016-06-16 ENCOUNTER — Ambulatory Visit (INDEPENDENT_AMBULATORY_CARE_PROVIDER_SITE_OTHER): Payer: Commercial Managed Care - PPO | Admitting: Physician Assistant

## 2016-06-16 ENCOUNTER — Encounter: Payer: Self-pay | Admitting: Physician Assistant

## 2016-06-16 VITALS — BP 110/70 | HR 84 | Temp 98.6°F | Resp 16 | Wt 153.0 lb

## 2016-06-16 DIAGNOSIS — R05 Cough: Secondary | ICD-10-CM | POA: Diagnosis not present

## 2016-06-16 DIAGNOSIS — J181 Lobar pneumonia, unspecified organism: Secondary | ICD-10-CM | POA: Diagnosis not present

## 2016-06-16 DIAGNOSIS — R059 Cough, unspecified: Secondary | ICD-10-CM

## 2016-06-16 DIAGNOSIS — J189 Pneumonia, unspecified organism: Secondary | ICD-10-CM

## 2016-06-16 MED ORDER — AZITHROMYCIN 250 MG PO TABS: ORAL_TABLET | ORAL | 1 refills | Status: DC

## 2016-06-16 MED ORDER — BENZONATATE 200 MG PO CAPS: 200.0000 mg | ORAL_CAPSULE | Freq: Two times a day (BID) | ORAL | 0 refills | Status: DC | PRN

## 2016-06-16 MED ORDER — PREDNISONE 10 MG (21) PO TBPK: ORAL_TABLET | ORAL | 0 refills | Status: DC

## 2016-06-16 NOTE — Progress Notes (Signed)
Patient: Donna Hoffman Female    DOB: [birth year]   56 y.o.   MRN: [id number] Visit Date: 06/16/2016  Today's Provider: Mar Daring, PA-C   Chief Complaint  Patient presents with  . Cough   Subjective:    HPI Cough: Patient complains of productive cough, sore throat and wheezing.  Symptoms began 2 days ago.  The cough is productive of clear sputum, with wheezing and is aggravated by nothing Associated symptoms include:wheezing. Patient does not have new pets. Patient does not have a history of asthma. Patient does not have a history of environmental allergens. Patient has not recent travel. Patient does have a history of smoking.  H/O pneumonia 20 yrs ago.    Allergies  Allergen Reactions  . Epinephrine   . Neomycin Hives  . Other Other (See Comments)    polymycin - blisters  . Penicillins Itching     Current Outpatient Prescriptions:  .  cholecalciferol (VITAMIN D) 1000 UNITS tablet, Take 1,000 Units by mouth daily., Disp: , Rfl:  .  meloxicam (MOBIC) 15 MG tablet, Take 1 tablet by mouth as needed., Disp: , Rfl:   Review of Systems  Constitutional: Positive for fatigue. Negative for chills and fever.  HENT: Positive for sore throat. Negative for ear pain, postnasal drip, rhinorrhea, sinus pain, sinus pressure, sneezing and trouble swallowing.   Respiratory: Positive for cough, chest tightness and wheezing.   Cardiovascular: Negative for chest pain, palpitations and leg swelling.  Gastrointestinal: Negative for abdominal pain and nausea.  Neurological: Negative for dizziness and headaches.    Social History  Substance Use Topics  . Smoking status: Former Smoker    Years: 5.00  . Smokeless tobacco: Never Used  . Alcohol use No   Objective:   BP 110/70 (BP Location: Right Arm, Patient Position: Sitting, Cuff Size: Large)   Pulse 84   Temp 98.6 F (37 C) (Oral)   Resp 16   Wt 153 lb (69.4 kg)   LMP 12/05/2012   SpO2 97%   BMI 25.46 kg/m   Physical  Exam  Constitutional: She appears well-developed and well-nourished. No distress.  HENT:  Head: Normocephalic and atraumatic.  Right Ear: Hearing, tympanic membrane, external ear and ear canal normal.  Left Ear: Hearing, tympanic membrane, external ear and ear canal normal.  Nose: Nose normal.  Mouth/Throat: Uvula is midline, oropharynx is clear and moist and mucous membranes are normal. No oropharyngeal exudate.  Eyes: Conjunctivae are normal. Pupils are equal, round, and reactive to light. Right eye exhibits no discharge. Left eye exhibits no discharge. No scleral icterus.  Neck: Normal range of motion. Neck supple. No tracheal deviation present. No thyromegaly present.  Cardiovascular: Normal rate, regular rhythm and normal heart sounds.  Exam reveals no gallop and no friction rub.   No murmur heard. Pulmonary/Chest: Effort normal. No stridor. No respiratory distress. She has decreased breath sounds in the right lower field. She has no wheezes. She has no rhonchi. She has no rales.  Lymphadenopathy:    She has no cervical adenopathy.  Skin: Skin is warm and dry. She is not diaphoretic.  Vitals reviewed.      Assessment & Plan:     1. Pneumonia of right lower lobe due to infectious organism [facility]) Discussed getting CXR with patient for decreased breath sounds in the RLL field. Patient declined because she recently had bone scan in 01/2016 and does not want radiation exposure. Will treat as if walking pneumonia  with Zpak, prednisone taper for the wheezing and tessalon perles for cough. She is to call if no improvement in symptoms so we may get CXR at that time. - azithromycin (ZITHROMAX) 250 MG tablet; Take 2 tab PO day 1 then 1 tab PO daily  Dispense: 6 each; Refill: 1 - predniSONE (STERAPRED UNI-PAK 21 TAB) 10 MG (21) TBPK tablet; Take as directed on package directions  Dispense: 21 tablet; Refill: 0  2. Cough Cough is worsening but not awakening her. Will give tessalon perles for cough,  especially for use while at work. - benzonatate (TESSALON) 200 MG capsule; Take 1 capsule (200 mg total) by mouth 2 (two) times daily as needed for cough.  Dispense: 30 capsule; Refill: 0       Mar Daring, PA-C  [facility]

## 2016-06-16 NOTE — Patient Instructions (Signed)
Community-Acquired Pneumonia, Adult °Pneumonia is an infection of the lungs. There are different types of pneumonia. One type can develop while a person is in a hospital. A different type, called community-acquired pneumonia, develops in people who are not, or have not recently been, in the hospital or other health care facility. °What are the causes? °Pneumonia may be caused by bacteria, viruses, or funguses. Community-acquired pneumonia is often caused by Streptococcus pneumonia bacteria. These bacteria are often passed from one person to another by breathing in droplets from the cough or sneeze of an infected person. °What increases the risk? °The condition is more likely to develop in: °· People who have chronic diseases, such as chronic obstructive pulmonary disease (COPD), asthma, congestive heart failure, cystic fibrosis, diabetes, or kidney disease. °· People who have early-stage or late-stage HIV. °· People who have sickle cell disease. °· People who have had their spleen removed (splenectomy). °· People who have poor dental hygiene. °· People who have medical conditions that increase the risk of breathing in (aspirating) secretions their own mouth and nose. °· People who have a weakened immune system (immunocompromised). °· People who smoke. °· People who travel to areas where pneumonia-causing germs commonly exist. °· People who are around animal habitats or animals that have pneumonia-causing germs, including birds, bats, rabbits, cats, and farm animals. ° °What are the signs or symptoms? °Symptoms of this condition include: °· A dry cough. °· A wet (productive) cough. °· Fever. °· Sweating. °· Chest pain, especially when breathing deeply or coughing. °· Rapid breathing or difficulty breathing. °· Shortness of breath. °· Shaking chills. °· Fatigue. °· Muscle aches. ° °How is this diagnosed? °Your health care provider will take a medical history and perform a physical exam. You may also have other tests,  including: °· Imaging studies of your chest, including X-rays. °· Tests to check your blood oxygen level and other blood gases. °· Other tests on blood, mucus (sputum), fluid around your lungs (pleural fluid), and urine. ° °If your pneumonia is severe, other tests may be done to identify the specific cause of your illness. °How is this treated? °The type of treatment that you receive depends on many factors, such as the cause of your pneumonia, the medicines you take, and other medical conditions that you have. For most adults, treatment and recovery from pneumonia may occur at home. In some cases, treatment must happen in a hospital. Treatment may include: °· Antibiotic medicines, if the pneumonia was caused by bacteria. °· Antiviral medicines, if the pneumonia was caused by a virus. °· Medicines that are given by mouth or through an IV tube. °· Oxygen. °· Respiratory therapy. ° °Although rare, treating severe pneumonia may include: °· Mechanical ventilation. This is done if you are not breathing well on your own and you cannot maintain a safe blood oxygen level. °· Thoracentesis. This procedure removes fluid around one lung or both lungs to help you breathe better. ° °Follow these instructions at home: °· Take over-the-counter and prescription medicines only as told by your health care provider. °? Only take cough medicine if you are losing sleep. Understand that cough medicine can prevent your body’s natural ability to remove mucus from your lungs. °? If you were prescribed an antibiotic medicine, take it as told by your health care provider. Do not stop taking the antibiotic even if you start to feel better. °· Sleep in a semi-upright position at night. Try sleeping in a reclining chair, or place a few pillows under your head. °· Do not use tobacco products, including cigarettes, chewing   tobacco, and e-cigarettes. If you need help quitting, ask your health care provider. °· Drink enough water to keep your urine  clear or pale yellow. This will help to thin out mucus secretions in your lungs. °How is this prevented? °There are ways that you can decrease your risk of developing community-acquired pneumonia. Consider getting a pneumococcal vaccine if: °· You are older than 56 years of age. °· You are older than 56 years of age and are undergoing cancer treatment, have chronic lung disease, or have other medical conditions that affect your immune system. Ask your health care provider if this applies to you. ° °There are different types and schedules of pneumococcal vaccines. Ask your health care provider which vaccination option is best for you. °You may also prevent community-acquired pneumonia if you take these actions: °· Get an influenza vaccine every year. Ask your health care provider which type of influenza vaccine is best for you. °· Go to the dentist on a regular basis. °· Wash your hands often. Use hand sanitizer if soap and water are not available. ° °Contact a health care provider if: °· You have a fever. °· You are losing sleep because you cannot control your cough with cough medicine. °Get help right away if: °· You have worsening shortness of breath. °· You have increased chest pain. °· Your sickness becomes worse, especially if you are an older adult or have a weakened immune system. °· You cough up blood. °This information is not intended to replace advice given to you by your health care provider. Make sure you discuss any questions you have with your health care provider. °Document Released: 05/31/2005 Document Revised: 10/09/2015 Document Reviewed: 09/25/2014 °Elsevier Interactive Patient Education © 2017 [employer] ° °

## 2016-06-29 ENCOUNTER — Encounter: Payer: Self-pay | Admitting: General Surgery

## 2016-06-29 ENCOUNTER — Ambulatory Visit (INDEPENDENT_AMBULATORY_CARE_PROVIDER_SITE_OTHER): Payer: Commercial Managed Care - PPO | Admitting: General Surgery

## 2016-06-29 VITALS — BP 128/74 | HR 76 | Resp 12 | Ht 65.0 in | Wt 153.0 lb

## 2016-06-29 DIAGNOSIS — Z853 Personal history of malignant neoplasm of breast: Secondary | ICD-10-CM | POA: Diagnosis not present

## 2016-06-29 NOTE — Patient Instructions (Signed)
The patient is aware to call back for any questions or concerns.  

## 2016-06-29 NOTE — Progress Notes (Signed)
Patient ID: Daylene Posey Robenson, female   DOB: [birth year], 56 y.o.   MRN: [id number]  Chief Complaint  Patient presents with  . Follow-up    HPI Daquisha Clermont Karstens is a 56 y.o. female.  Follow up bilateral mastectomy. She states she is doing well. Recovering from pneumonia.  HPI  Past Medical History:  Diagnosis Date  . 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  Plan adjuvant chemotherapy.  Marland Kitchen GERD (gastroesophageal reflux disease)   . Heart murmur   . Malignant neoplasm of breast (female), unspecified site 05/11/2010   Invasive ductal carcinoma, extensive DCIS. BRCA negative/ right breast    Past Surgical History:  Procedure Laterality Date  . ABDOMINAL HYSTERECTOMY  05/04/13   total hysterectomy  . BREAST SURGERY Left 05/27/2011   Left simple mastectomy for benign disease.  Marland Kitchen BREAST SURGERY Right 05/28/2010   Right simple mastectomy/sentinel node biopsy/breast ca  . COLONOSCOPY  2012  . COLONOSCOPY  03/09/2011   Normal exam  . DILATION AND CURETTAGE OF UTERUS    . MASTECTOMY Bilateral 2011,2012  . UPPER GI ENDOSCOPY      Family History  Problem Relation Age of Onset  . Breast cancer Mother     x2  . Arthritis Mother   . Cancer Mother     breast  . Colon polyps Father   . Diabetes Father   . Hypertension Father   . Cancer Father     skin  . Healthy Sister   . Healthy Sister   . Healthy Sister   . Colon cancer Paternal Grandfather     Social History Social History  Substance Use Topics  . Smoking status: Former Smoker    Years: 5.00  . Smokeless tobacco: Never Used  . Alcohol use No    Allergies  Allergen Reactions  . Epinephrine   . Neomycin Hives  . Other Other (See Comments)    polymycin - blisters  . Penicillins Itching    Current Outpatient Prescriptions  Medication Sig Dispense Refill  . cholecalciferol (VITAMIN D) 1000 UNITS tablet Take 1,000 Units by mouth daily.     . meloxicam (MOBIC) 15 MG tablet Take 1 tablet by mouth as needed.     No current facility-administered medications for this visit.     Review of Systems Review of Systems  Constitutional: Negative.   Respiratory: Positive for cough.   Cardiovascular: Negative.     Blood pressure 128/74, pulse 76, resp. rate 12, height _0  (1.651 m), weight 153 lb (69.4 kg), last menstrual period 12/05/2012.  Physical Exam Physical Exam  Constitutional: She is oriented to person, place, and time. She appears well-developed and well-nourished.  HENT:  Mouth/Throat: Oropharynx is clear and moist.  Eyes: Conjunctivae are normal. No scleral icterus.  Neck: Neck supple.  Cardiovascular: Normal rate, regular rhythm and normal heart sounds.   Pulmonary/Chest: Effort normal and breath sounds normal.    Bilateral mastectomy incisions well healed.  Lymphadenopathy:    She has no cervical adenopathy.    She has no axillary adenopathy.  Neurological: She is alert and oriented to person, place, and time.  Skin: Skin is warm and dry.  Psychiatric: Her behavior is normal.    Data Reviewed Medical oncology notes from 03/28/2016 with Delight Hoh, M.D. reviewed. She has been released for medical oncology follow-up. Bone scan results from 01/20/2016 showed degenerative uptake at multiple levels of the spine.  Noticed metastatic disease.  CA 27-29 obtained in August 2017 was normal at 10.3. At the time of the patient's original diagnosis in 2011 CA 27-29 level was normal at 19.0.   Assessment    No evidence of recurrent tumor.    Plan    The little likelihood that ongoing measurement of CA 27-29 would be of benefit in light of its normal values prior to surgical extirpation of the tumor were discussed.    A prescription for bilateral breast prostheses and surgical bras was provided.  Follow up in one year. The patient is aware to call back for any questions or concerns.   This information  has been scribed by Karie Fetch RN, BSN,BC.   Robert Bellow 06/30/2016, 1:49 PM

## 2016-12-14 ENCOUNTER — Telehealth: Payer: Self-pay | Admitting: Physician Assistant

## 2017-01-01 ENCOUNTER — Telehealth: Payer: Self-pay | Admitting: General Surgery

## 2017-01-01 NOTE — Telephone Encounter (Signed)
Contacted the patient to confirm that she had received the multiple supplies from [employer]" for her post mastectomy comfort.

## 2017-01-13 ENCOUNTER — Telehealth: Payer: Self-pay | Admitting: Physician Assistant

## 2017-01-13 NOTE — Telephone Encounter (Signed)
Left message to see if she could come in at 9 on 01/21/17 for AWV w [facility].  She is aware there will be delay between appts as CPE appt is at 10 with Dr. Anastasia Fiedler

## 2017-01-21 ENCOUNTER — Ambulatory Visit (INDEPENDENT_AMBULATORY_CARE_PROVIDER_SITE_OTHER): Payer: Commercial Managed Care - PPO | Admitting: Family Medicine

## 2017-01-21 ENCOUNTER — Ambulatory Visit: Payer: Commercial Managed Care - PPO

## 2017-01-21 ENCOUNTER — Encounter: Payer: Self-pay | Admitting: Family Medicine

## 2017-01-21 VITALS — BP 100/56 | HR 92 | Temp 98.1°F | Resp 16 | Ht 65.0 in | Wt 150.0 lb

## 2017-01-21 DIAGNOSIS — Z23 Encounter for immunization: Secondary | ICD-10-CM

## 2017-01-21 DIAGNOSIS — E7800 Pure hypercholesterolemia, unspecified: Secondary | ICD-10-CM | POA: Diagnosis not present

## 2017-01-21 DIAGNOSIS — M5441 Lumbago with sciatica, right side: Secondary | ICD-10-CM

## 2017-01-21 DIAGNOSIS — E559 Vitamin D deficiency, unspecified: Secondary | ICD-10-CM | POA: Diagnosis not present

## 2017-01-21 DIAGNOSIS — G8929 Other chronic pain: Secondary | ICD-10-CM

## 2017-01-21 DIAGNOSIS — Z853 Personal history of malignant neoplasm of breast: Secondary | ICD-10-CM

## 2017-01-21 DIAGNOSIS — Z114 Encounter for screening for human immunodeficiency virus [HIV]: Secondary | ICD-10-CM

## 2017-01-21 DIAGNOSIS — Z0000 Encounter for general adult medical examination without abnormal findings: Secondary | ICD-10-CM | POA: Diagnosis not present

## 2017-01-21 DIAGNOSIS — M545 Low back pain, unspecified: Secondary | ICD-10-CM | POA: Insufficient documentation

## 2017-01-21 NOTE — Assessment & Plan Note (Signed)
Recheck vitamin D level 

## 2017-01-21 NOTE — Progress Notes (Signed)
Patient: Donna Hoffman, Female    DOB: Feb 11, 1961, 56 y.o.   MRN: [id number] Visit Date: 01/21/2017  Today's Provider: Lavon Paganini, MD   Chief Complaint  Patient presents with  . Annual Exam  . URI   Subjective:    Annual physical exam Donna Hoffman is a 56 y.o. female who presents today for health maintenance and complete physical. She feels fairly well. She reports exercising about 3 times per week for 20 minutes. She walks and cycles. She reports she is sleeping fairly well.  Last CPE- 06/05/2015 Last Pap- Negative.  HPV negative. S/P hysterectomy November 2014. Last colonoscopy- 03/09/2011- WNL. Repeat 10 years She has a H/O right breast cancer, and is s/p bilateral mastectomy. ----------------------------------------------------------------- Upper Respiratory Infection: Patient complains of symptoms of a URI, possible sinusitis. Symptoms include congestion, cough, fever, sore throat, swollen glands and bilateral ear "itching". Onset of symptoms was 5 days ago, gradually worsening since that time. She also c/o cough described as nonproductive and both dry and productive, nasal congestion, post nasal drip and sinus pressure for the past 5 days .Treatment to date: antihistamines and Aleve, with relief.Marland Kitchen  LBP - intermittent, chronic for several years - R sided with sciatica - intermittent flares, despite yoga and walking regularly - would like to see a specialist to discuss - Bone Scan in 2017 with no malignancy, but diffuse degenerative uptake  Review of Systems  Constitutional: Positive for chills and fever (99.9 per pt).  HENT: Positive for congestion, rhinorrhea, sinus pressure, sneezing, sore throat and tinnitus.   Eyes: Negative.   Respiratory: Positive for cough.   Cardiovascular: Negative.   Gastrointestinal: Negative.   Endocrine: Negative.   Genitourinary: Negative.   Musculoskeletal: Positive for back pain.  Neurological: Negative.     Psychiatric/Behavioral: Negative.   All other systems reviewed and are negative.   Social History      She  reports that she has quit smoking. She quit after 0.00 years of use. She has never used smokeless tobacco. She reports that she does not drink alcohol or use drugs.       Social History   Social History  . Marital status: Married    Spouse name: Ronalee Belts  . Number of children: 2  . Years of education: some college   Occupational History  .  [employer]   Social History Main Topics  . Smoking status: Former Smoker    Years: 0.00  . Smokeless tobacco: Never Used     Comment: was previously social smoker  . Alcohol use No  . Drug use: No  . Sexual activity: Yes   Other Topics Concern  . None   Social History Narrative  . None    Past Medical History:  Diagnosis Date  . 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  Plan adjuvant chemotherapy.  Marland Kitchen GERD (gastroesophageal reflux disease)   . Heart murmur   . Malignant neoplasm of breast (female), unspecified site 05/11/2010   Invasive ductal carcinoma, extensive DCIS. BRCA negative/ right breast     Patient Active Problem List   Diagnosis Date Noted  . Elevated liver enzymes 09/24/2015  . Body mass index (BMI) of 25.0-25.9 in adult 06/05/2015  . Primary cancer of upper outer quadrant of right female breast ([facility]) 06/05/2015  . Bursitis 06/05/2015  . Hypercholesterolemia 06/05/2015  . Irregular bleeding 06/05/2015  . Arthralgia of hip  06/05/2015  . Adenitis 06/05/2015  . Cervical pain 06/05/2015  . Neutropenia ([place]) 06/05/2015  . Avitaminosis D 06/05/2015  . History of breast cancer 07/04/2013  . Umbilical hernia 40/98/1191  . Allergic rhinitis 07/29/2008  . Acid reflux 07/04/2008  . 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 ABDOMINAL HYSTERECTOMY  05/04/13   total  hysterectomy  . BREAST SURGERY Left 05/27/2011   Left simple mastectomy for benign disease.  Marland Kitchen BREAST SURGERY Right 05/28/2010   Right simple mastectomy/sentinel node biopsy/breast ca  . COLONOSCOPY  2012  . COLONOSCOPY  03/09/2011   Normal exam  . DILATION AND CURETTAGE OF UTERUS    . MASTECTOMY Bilateral 2011,2012  . UPPER GI ENDOSCOPY      Family History        Family Status  Relation Status  . Mother Alive  . Father Alive  . Sister Alive  . Brother Alive  . Sister Alive  . Sister Alive  . PGF (Not Specified)        Her family history includes Arthritis in her mother and sister; Breast cancer in her mother; Cancer in her father and mother; Colon cancer in her paternal grandfather; Colon polyps in her father; Diabetes in her father; Healthy in her brother, sister, and sister; Hypertension in her father.     Allergies  Allergen Reactions  . Iodinated Diagnostic Agents Swelling    Eye swelling  . Epinephrine   . Neomycin Hives  . Other Other (See Comments)    polymycin - blisters  . Penicillins Itching     Current Outpatient Prescriptions:  .  cholecalciferol (VITAMIN D) 1000 UNITS tablet, Take 1,000 Units by mouth daily., Disp: , Rfl:  .  meloxicam (MOBIC) 15 MG tablet, Take 1 tablet by mouth as needed., Disp: , Rfl:    Patient Care Team: Virginia Crews, MD as PCP - General (Family Medicine) Bary Castilla, Forest Gleason, MD (General Surgery) Margarita Rana, MD as Referring Physician (Family Medicine)      Objective:   Vitals: BP (!) 100/56 (BP Location: Left Arm, Patient Position: Sitting, Cuff Size: Normal)   Pulse 92   Temp 98.1 F (36.7 C) (Oral)   Resp 16   Ht '5\' 5"'$  (1.651 m)   Wt 150 lb (68 kg)   LMP 12/05/2012   SpO2 97%   BMI 24.96 kg/m    Vitals:   01/21/17 0934  BP: (!) 100/56  Pulse: 92  Resp: 16  Temp: 98.1 F (36.7 C)  TempSrc: Oral  SpO2: 97%  Weight: 150 lb (68 kg)  Height: '5\' 5"'$  (1.651 m)     Physical Exam  Constitutional: She  is oriented to person, place, and time. She appears well-developed and well-nourished.  HENT:  Head: Normocephalic and atraumatic.  Mouth/Throat: Oropharynx is clear and moist.  TMs clear b/l  Eyes: Pupils are equal, round, and reactive to light. Conjunctivae and EOM are normal. No scleral icterus.  Neck: Neck supple. No thyromegaly present.  Cardiovascular: Normal rate, regular rhythm, normal heart sounds and intact distal pulses.   No murmur heard. Pulmonary/Chest: Effort normal and breath sounds normal. No respiratory distress. She has no wheezes.  Abdominal: Soft. Bowel sounds are normal. She exhibits no distension and no mass. There is no tenderness. There is no rebound and no guarding.  Musculoskeletal: She exhibits no edema or deformity.  Lymphadenopathy:    She has no cervical adenopathy.  Neurological: She is  alert and oriented to person, place, and time. No cranial nerve deficit.  Skin: Skin is warm and dry. No rash noted.  Psychiatric: She has a normal mood and affect. Her behavior is normal.  Vitals reviewed.    Depression Screen PHQ 2/9 Scores 01/21/2017 06/05/2015  PHQ - 2 Score 0 0      Assessment & Plan:     Routine Health Maintenance and Physical Exam  Exercise Activities and Dietary recommendations Goals    . Exercise 150 minutes per week (moderate activity)       Immunization History  Administered Date(s) Administered  . Td 06/14/2006    Health Maintenance  Topic Date Due  . HIV Screening  08/03/1975  . PAP SMEAR  08/02/1981  . MAMMOGRAM  08/02/2010  . TETANUS/TDAP  06/14/2016  . INFLUENZA VACCINE  06/16/2017 (Originally 01/12/2017)  . COLONOSCOPY  03/08/2021  . Hepatitis C Screening  Completed     Discussed health benefits of physical activity, and encouraged her to engage in regular exercise appropriate for her age and condition.    --------------------------------------------------------------------   Low back pain Referral to Ortho,  per patient request Discussed back strengthening and stretching exercises No symptoms currently  Hypercholesterolemia Recheck lipid panel No meds currently  History of breast cancer Followed annually by Surgery for breast exams  Avitaminosis D Recheck vitamin D level   Lavon Paganini, MD  [facility]

## 2017-01-21 NOTE — Assessment & Plan Note (Signed)
Recheck lipid panel No meds currently

## 2017-01-21 NOTE — Assessment & Plan Note (Signed)
Followed annually by Surgery for breast exams

## 2017-01-21 NOTE — Assessment & Plan Note (Signed)
Referral to Ortho, per patient request Discussed back strengthening and stretching exercises No symptoms currently

## 2017-01-21 NOTE — Patient Instructions (Signed)

## 2017-06-30 ENCOUNTER — Inpatient Hospital Stay: Payer: Self-pay

## 2017-06-30 ENCOUNTER — Encounter: Payer: Self-pay | Admitting: General Surgery

## 2017-06-30 ENCOUNTER — Ambulatory Visit: Payer: Commercial Managed Care - PPO | Admitting: General Surgery

## 2017-06-30 DIAGNOSIS — Z853 Personal history of malignant neoplasm of breast: Secondary | ICD-10-CM

## 2017-06-30 NOTE — Progress Notes (Signed)
Patient ID: Donna Hoffman, female   DOB: Dec 31, 1960, 57 y.o.   MRN: [id number]  Chief Complaint  Patient presents with  . Follow-up    HPI Donna Hoffman is a 57 y.o. female.  Here for follow up breast cancer.Patient states she is doing well. She states she is having some itching on her right chest wall area about a month now.  HPI  Past Medical History:  Diagnosis Date  . 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  Plan adjuvant chemotherapy.  Marland Kitchen GERD (gastroesophageal reflux disease)   . Heart murmur   . Malignant neoplasm of breast (female), unspecified site 05/11/2010   Invasive ductal carcinoma, extensive DCIS. BRCA negative/ right breast    Past Surgical History:  Procedure Laterality Date  . ABDOMINAL HYSTERECTOMY  05/04/13   total hysterectomy  . BREAST SURGERY Left 05/27/2011   Left simple mastectomy for benign disease.  Marland Kitchen BREAST SURGERY Right 05/28/2010   Right simple mastectomy/sentinel node biopsy/breast ca  . COLONOSCOPY  2012  . COLONOSCOPY  03/09/2011   Normal exam  . DILATION AND CURETTAGE OF UTERUS    . MASTECTOMY Bilateral 2011,2012  . UPPER GI ENDOSCOPY      Family History  Problem Relation Age of Onset  . Breast cancer Mother        x2  . Arthritis Mother   . Cancer Mother        breast  . Colon polyps Father   . Diabetes Father   . Hypertension Father   . Cancer Father        skin  . Arthritis Sister   . Healthy Brother   . Healthy Sister   . Healthy Sister   . Colon cancer Paternal Grandfather     Social History Social History   Tobacco Use  . Smoking status: Former Smoker    Years: 0.00  . Smokeless tobacco: Never Used  . Tobacco comment: was previously social smoker  Substance Use Topics  . Alcohol use: No  . Drug use: No    Allergies  Allergen Reactions  . Iodinated Diagnostic Agents Swelling    Eye swelling  . Epinephrine   . Neomycin Hives   . Other Other (See Comments)    polymycin - blisters  . Penicillins Itching    Current Outpatient Medications  Medication Sig Dispense Refill  . CALCIUM CARBONATE PO Take 300 mg by mouth.    . cholecalciferol (VITAMIN D) 1000 UNITS tablet Take 1,000 Units by mouth daily.    . meloxicam (MOBIC) 15 MG tablet Take 1 tablet by mouth as needed.     No current facility-administered medications for this visit.     Review of Systems Review of Systems  Constitutional: Negative.   Respiratory: Negative.   Cardiovascular: Negative.     Blood pressure 132/76, pulse 87, height '5\' 5"'$  (1.651 m), weight 151 lb (68.5 kg), last menstrual period 12/05/2012.  Physical Exam Physical Exam  Constitutional: She is oriented to person, place, and time. She appears well-developed and well-nourished.  Cardiovascular: Normal rate, regular rhythm and normal heart sounds.  Pulmonary/Chest: Effort normal and breath sounds normal.    Neurological: She is alert and oriented to person, place, and time.  Skin: Skin is warm and dry.    Data Reviewed Ultrasound examination of the right chest wall in the area where she is appreciated some pruritic symptoms showed no discernible  abnormality of the skin or subcutaneous tissue.  Incidentally noted outside the area of the patient's clinical concern on the lateral aspect of the chest wall, below the level of the mastectomy incision showed a 0.35 x 0.7 cm structure thought to represent a lymph node.  Scant cortical thickening at 0.26 cm.  The patient was amenable to FNA sampling and this was completed making use of 1 cc of 1% plain Xylocaine.  Multiple passes through the lymph node were completed and slides x2 were prepared for cytology.  Assessment    No clinical evidence of breast cancer recurrence.  Nonpalpable lymph node identified on ultrasound exam.    Plan    The patient will be contacted when cytology results are available.  She is aware this will  likely be on January 21-22.    Patient to return in one year.  The patient is aware to call back for any questions or concerns.  HPI, Physical Exam, Assessment and Plan have been scribed under the direction and in the presence of Hervey Ard, MD.  Gaspar Cola, CMA  I have completed the exam and reviewed the above documentation for accuracy and completeness.  I agree with the above.  Haematologist has been used and any errors in dictation or transcription are unintentional.  Hervey Ard, M.D., F.A.C.S.  Forest Gleason Karolyn Messing 06/30/2017, 7:33 PM

## 2017-07-12 ENCOUNTER — Encounter: Payer: Self-pay | Admitting: General Surgery

## 2017-07-12 ENCOUNTER — Inpatient Hospital Stay (INDEPENDENT_AMBULATORY_CARE_PROVIDER_SITE_OTHER): Payer: Commercial Managed Care - PPO

## 2017-07-12 ENCOUNTER — Ambulatory Visit: Payer: Commercial Managed Care - PPO | Admitting: General Surgery

## 2017-07-12 VITALS — BP 126/74 | HR 72 | Resp 12 | Ht 65.0 in | Wt 165.0 lb

## 2017-07-12 DIAGNOSIS — R599 Enlarged lymph nodes, unspecified: Secondary | ICD-10-CM

## 2017-07-12 DIAGNOSIS — R590 Localized enlarged lymph nodes: Secondary | ICD-10-CM

## 2017-07-12 NOTE — Patient Instructions (Addendum)
One week nurse check.     CARE AFTER BREAST BIOPSY  1. Leave the dressing on that your doctor applied after the biopsy. It is waterproof. You may bathe, shower and/or swim. The dressing can be removed in 3 days, you will see small strips of tape against your skin on the incision. Do not remove these strips they will gradually fall off in about 2-3 weeks. You may use an ice pack on and off for the first 12-24 hours for comfort.  2. You may want to use a gauze,cloth or similar protection in your bra to prevent rubbing against your dressing and incision. This is not necessary, but you may feel more comfortable doing so.  3. It is recommended that you wear a bra day and night to give support to the breast. This will prevent the weight of the breast from pulling on the incision.  4. Your breast may feel hard and lumpy under the incision. Do not be alarmed. This is the underlying stitching of tissue. Softening of this tissue will occur in time.  5. You may have a follow up appointment or phone follow up in one week after your biopsy. The office phone number is [phone].  6. You will notice about a week or two after your office visit that the strips of the tape on your incision will begin to loosen. These may then be removed.  7. Report to your doctor any of the following:  * Severe pain not relieved by your pain medication  *Redness of the incision  * Drainage from the incision  *Fever greater than 101 degrees

## 2017-07-12 NOTE — Progress Notes (Signed)
Patient ID: Donna Hoffman, female   DOB: Nov 13, 1960, 57 y.o.   MRN: [id number]  Chief Complaint  Patient presents with  . Procedure    HPI Donna Hoffman is a 57 y.o. female here today for a Right lymph node biopsy.  HPI  Past Medical History:  Diagnosis Date  . 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  Plan adjuvant chemotherapy.  Marland Kitchen GERD (gastroesophageal reflux disease)   . Heart murmur   . Malignant neoplasm of breast (female), unspecified site 05/11/2010   Invasive ductal carcinoma, extensive DCIS. BRCA negative/ right breast    Past Surgical History:  Procedure Laterality Date  . ABDOMINAL HYSTERECTOMY  05/04/13   total hysterectomy  . BREAST SURGERY Left 05/27/2011   Left simple mastectomy for benign disease.  Marland Kitchen BREAST SURGERY Right 05/28/2010   Right simple mastectomy/sentinel node biopsy/breast ca  . COLONOSCOPY  2012  . COLONOSCOPY  03/09/2011   Normal exam  . DILATION AND CURETTAGE OF UTERUS    . MASTECTOMY Bilateral 2011,2012  . UPPER GI ENDOSCOPY      Family History  Problem Relation Age of Onset  . Breast cancer Mother        x2  . Arthritis Mother   . Cancer Mother        breast  . Colon polyps Father   . Diabetes Father   . Hypertension Father   . Cancer Father        skin  . Arthritis Sister   . Healthy Brother   . Healthy Sister   . Healthy Sister   . Colon cancer Paternal Grandfather     Social History Social History   Tobacco Use  . Smoking status: Former Smoker    Years: 0.00  . Smokeless tobacco: Never Used  . Tobacco comment: was previously social smoker  Substance Use Topics  . Alcohol use: No  . Drug use: No    Allergies  Allergen Reactions  . Iodinated Diagnostic Agents Swelling    Eye swelling  . Epinephrine   . Neomycin Hives  . Other Other (See Comments)    polymycin - blisters  . Penicillins Itching    Current Outpatient  Medications  Medication Sig Dispense Refill  . CALCIUM CARBONATE PO Take 300 mg by mouth.    . cholecalciferol (VITAMIN D) 1000 UNITS tablet Take 1,000 Units by mouth daily.    . meloxicam (MOBIC) 15 MG tablet Take 1 tablet by mouth as needed.     No current facility-administered medications for this visit.     Review of Systems Review of Systems  Respiratory: Negative.   Cardiovascular: Negative.     Blood pressure 126/74, pulse 72, resp. rate 12, height '5\' 5"'$  (1.651 m), weight 165 lb (74.8 kg), last menstrual period 12/05/2012.  Physical Exam Physical Exam  Pulmonary/Chest:      Data Reviewed June 30, 2017 cytology  LYMPHOID CELLS PRESENT. SEE COMMENT. Claudette Laws MD Pathologist, Electronic Signature (Case signed 07/04/2017) Comment Comment: There are numerous lymphocytes and the airdryed slide has some partially degenerated cells which are difficult to characterize. Tissue studies may be helpful to accurately evaluate the leison.  Assessment    Small lymph node, likely benign.    Plan With the cytology not being slam dunk normal, the patient desired to proceed to node excision.  Under ultrasound guidance the lymph node was identified.  10 cc of  0.5% Xylocaine with 0.25% Marcaine with 1-200,000 units of epinephrine was utilized and well-tolerated.  This was supplemented with an additional 3 cc of 1% plain Xylocaine during the procedure.  ChloraPrep was applied to the skin.  A small transverse incision was made over the lesion and the lymph node excised with a small amount of adjacent adipose tissue.  The wound was closed with a 3-0 Vicryl figure-of-eight suture to the adipose layer.  The skin was closed with a running 3-0 Vicryl subarticular suture.  Benzoin, Steri-Strips followed by Telfa and Tegaderm dressing applied.  The patient tolerated the procedure well.  She will make use of Tylenol or nonsteroidal for comfort as well as ice.  Nursing follow-up in 1 week.   She will be contacted when pathology is available.  Return in one week nurse check. The patient is aware to call back for any questions or concerns.  HPI, Physical Exam, Assessment and Plan have been scribed under the direction and in the presence of Hervey Ard, MD.  Gaspar Cola, CMA     I have completed the exam and reviewed the above documentation for accuracy and completeness.  I agree with the above.  Haematologist has been used and any errors in dictation or transcription are unintentional.  Hervey Ard, M.D., F.A.C.S.    Forest Gleason Aidaly Cordner 07/12/2017, 5:07 PM

## 2017-07-13 ENCOUNTER — Other Ambulatory Visit: Payer: Self-pay | Admitting: General Surgery

## 2017-07-19 ENCOUNTER — Ambulatory Visit: Payer: Commercial Managed Care - PPO | Admitting: General Surgery

## 2017-07-19 ENCOUNTER — Ambulatory Visit (INDEPENDENT_AMBULATORY_CARE_PROVIDER_SITE_OTHER): Payer: Commercial Managed Care - PPO | Admitting: *Deleted

## 2017-07-19 DIAGNOSIS — R590 Localized enlarged lymph nodes: Secondary | ICD-10-CM

## 2017-07-19 NOTE — Progress Notes (Signed)
Patient came in today for a wound check.  The wound is clean and a little rash from steri sttrips.  Removed steri strips applied.. Follow up as scheduled.

## 2017-07-19 NOTE — Patient Instructions (Signed)
Return as needed

## 2017-07-20 NOTE — Telephone Encounter (Signed)
CPE 01/21/2017

## 2017-08-13 IMAGING — NM NM BONE WHOLE BODY
2 series · 10 of 10 positions shown · non-contrast
Comparison: None

Radiographic correlation:  09/26/2015 CT abdomen/pelvis

CLINICAL DATA: Breast cancer, low back and LEFT hip pain, history
of LEFT hip bursitis and scoliosis, BILATERAL double mastectomy

EXAM:
NUCLEAR MEDICINE WHOLE BODY BONE SCAN
TECHNIQUE: Whole body anterior and posterior images were obtained approximately
3 hours after intravenous injection of radiopharmaceutical.
RADIOPHARMACEUTICALS:  22 mCi 4echnetium-KKm MDP IV

[Series 1000: 3 hr wholebody · 2.40mm/px · 2 of 2 frames shown]
[frame 1/2]
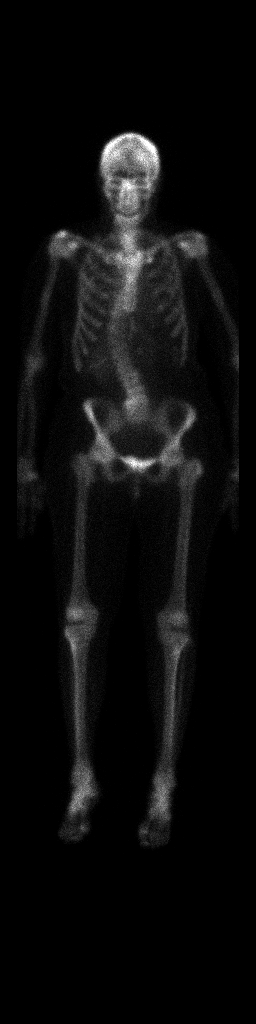
[frame 2/2]
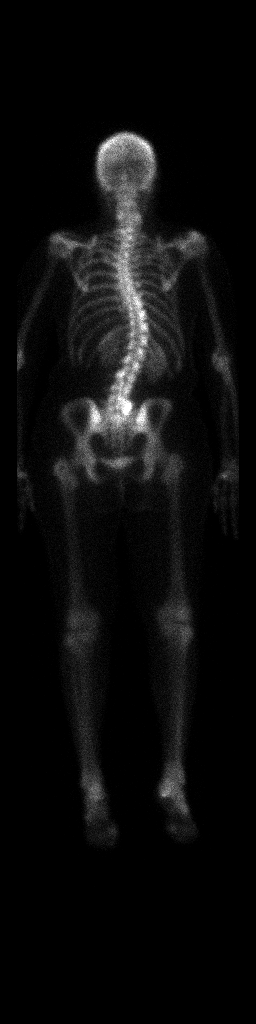

[Series 1000: statics · 2.40mm/px · 4 acquisitions, 8 frames shown]
[im 1/4]
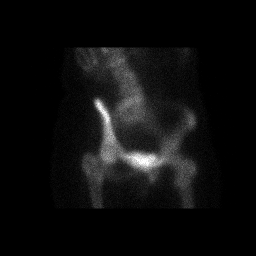
[im 1/4]
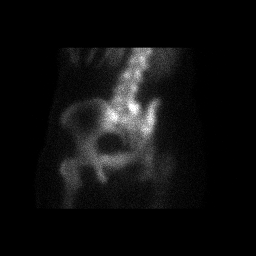
[im 2/4]
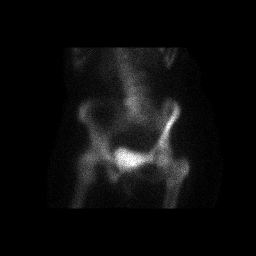
[im 2/4]
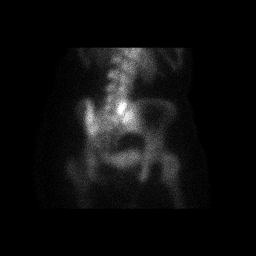
[im 3/4]
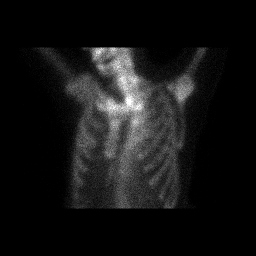
[im 3/4]
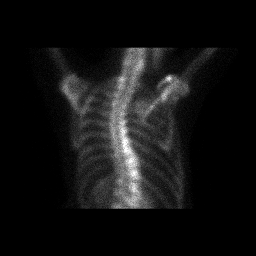
[im 4/4]
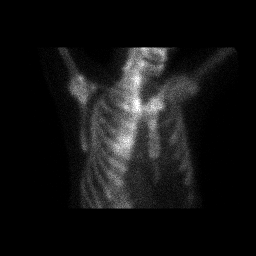
[im 4/4]
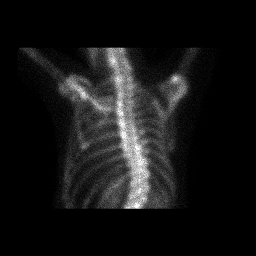

[10 of 10 positions shown; findings below may reference images not displayed]

FINDINGS: Dextroconvex thoracolumbar scoliosis.

Minimal uptake at the shoulders RIGHT greater than LEFT,
sternoclavicular joints, at LEFT first costochondral junction, and
at the cervical spine, typically degenerative.

Increased tracet localization at RIGHT L5-S1, corresponding to
significant facet degenerative changes on CT.

No additional abnormal osseous tracer accumulation identified.

Expected urinary tract and soft tissue distribution of tracer.
IMPRESSION: Dextroconvex thoracolumbar scoliosis.

Scattered degenerative type uptake as above.

No definite scintigraphic evidence of osseous metastatic disease.

## 2017-11-28 ENCOUNTER — Encounter: Payer: Self-pay | Admitting: Family Medicine

## 2017-11-28 ENCOUNTER — Ambulatory Visit (INDEPENDENT_AMBULATORY_CARE_PROVIDER_SITE_OTHER): Payer: Commercial Managed Care - PPO | Admitting: Family Medicine

## 2017-11-28 VITALS — BP 132/80 | HR 76 | Temp 98.4°F | Resp 16 | Ht 65.0 in | Wt 151.0 lb

## 2017-11-28 DIAGNOSIS — D709 Neutropenia, unspecified: Secondary | ICD-10-CM

## 2017-11-28 DIAGNOSIS — Z0000 Encounter for general adult medical examination without abnormal findings: Secondary | ICD-10-CM

## 2017-11-28 DIAGNOSIS — E7800 Pure hypercholesterolemia, unspecified: Secondary | ICD-10-CM

## 2017-11-28 DIAGNOSIS — N952 Postmenopausal atrophic vaginitis: Secondary | ICD-10-CM | POA: Diagnosis not present

## 2017-11-28 DIAGNOSIS — E559 Vitamin D deficiency, unspecified: Secondary | ICD-10-CM | POA: Diagnosis not present

## 2017-11-28 DIAGNOSIS — M25561 Pain in right knee: Secondary | ICD-10-CM | POA: Diagnosis not present

## 2017-11-28 DIAGNOSIS — Z114 Encounter for screening for human immunodeficiency virus [HIV]: Secondary | ICD-10-CM

## 2017-11-28 MED ORDER — ESTRADIOL 0.1 MG/GM VA CREA: 1.0000 | TOPICAL_CREAM | VAGINAL | 12 refills | Status: DC

## 2017-11-28 NOTE — Progress Notes (Signed)
Patient: Donna Hoffman, Female    DOB: 1961/03/29, 57 y.o.   MRN: [id number] Visit Date: 11/28/2017  Today's Provider: Lavon Paganini, MD   Chief Complaint  Patient presents with  . Annual Exam   Subjective:    Annual physical exam Kseniya Grunden Beougher is a 57 y.o. female who presents today for health maintenance and complete physical. She feels fairly well.   She is c/o pain in her right knee.  The pain is been present intermittently for 2 to 3 months.  She has never had knee pain previous to this.  She states that the knee feels warm, but does not have swelling or redness.  There is no instability or giving out.  It hurts worse when she is walking downstairs.  The pain feels like there is something underneath her kneecap.  It is better with rest and side stepping down the stairs.  She does take meloxicam chronically for her back pain.  She has not tried any medications for her knee pain.  She is also c/o vaginal dryness and pain during intercourse. Ongoing for ~1 yr. she is status post total hysterectomy with BSO in 04/2013.  She states that her mother has had similar problems and tried estrogen cream which seems to help tremendously.  She would like to try this if possible.  She reports exercising 3-4 times per week for 15 minutes. She reports she is sleeping fairly well. Does wake up early, and has difficulty falling back asleep.  Last CPE- 06/05/2015 Last mammogram- 07/12/2017- F/B surgery for H/O breast cancer Last colonoscopy- 03/09/2011- WNL. Repeat 10 years. -----------------------------------------------------------------   Review of Systems  Constitutional: Negative.   HENT: Positive for tinnitus. Negative for congestion, dental problem, drooling, ear discharge, ear pain, facial swelling, hearing loss, mouth sores, nosebleeds, postnasal drip, rhinorrhea, sinus pressure, sinus pain, sneezing, sore throat, trouble swallowing and voice change.   Eyes: Negative.     Respiratory: Negative.   Cardiovascular: Negative.   Gastrointestinal: Negative.   Endocrine: Negative.   Genitourinary: Positive for dyspareunia and vaginal pain. Negative for decreased urine volume, difficulty urinating, dysuria, enuresis, flank pain, frequency, genital sores, hematuria, menstrual problem, pelvic pain, urgency, vaginal bleeding and vaginal discharge.  Musculoskeletal: Positive for arthralgias and back pain. Negative for gait problem, joint swelling, myalgias, neck pain and neck stiffness.  Skin: Negative.   Allergic/Immunologic: Negative.   Neurological: Negative.   Hematological: Negative.   Psychiatric/Behavioral: Negative.     Social History      She  reports that she has quit smoking. She quit after 0.00 years of use. She has never used smokeless tobacco. She reports that she does not drink alcohol or use drugs.       Social History   Socioeconomic History  . Marital status: Married    Spouse name: Ronalee Belts  . Number of children: 2  . Years of education: some college  . Highest education level: Some college, no degree  Occupational History    Employer: [employer]  Social Needs  . Financial resource strain: Not hard at all  . Food insecurity:    Worry: Never true    Inability: Never true  . Transportation needs:    Medical: No    Non-medical: No  Tobacco Use  . Smoking status: Former Smoker    Years: 0.00  . Smokeless tobacco: Never Used  . Tobacco comment: was previously social smoker  Substance and Sexual Activity  . Alcohol use: No  .  Drug use: No  . Sexual activity: Yes  Lifestyle  . Physical activity:    Days per week: 3 days    Minutes per session: 20 min  . Stress: Not on file  Relationships  . Social connections:    Talks on phone: Not on file    Gets together: Not on file    Attends religious service: Not on file    Active member of club or organization: Not on file    Attends meetings of clubs or organizations: Not on file     Relationship status: Not on file  Other Topics Concern  . Not on file  Social History Narrative  . Not on file    Past Medical History:  Diagnosis Date  . 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  Plan adjuvant chemotherapy.  Marland Kitchen GERD (gastroesophageal reflux disease)   . Heart murmur   . Malignant neoplasm of breast (female), unspecified site 05/11/2010   Invasive ductal carcinoma, extensive DCIS. BRCA negative/ right breast     Patient Active Problem List   Diagnosis Date Noted  . Low back pain 01/21/2017  . Elevated liver enzymes 09/24/2015  . Primary cancer of upper outer quadrant of right female breast ([place]) 06/05/2015  . Hypercholesterolemia 06/05/2015  . Irregular bleeding 06/05/2015  . Arthralgia of hip 06/05/2015  . Adenitis 06/05/2015  . Neutropenia ([place]) 06/05/2015  . Avitaminosis D 06/05/2015  . History of breast cancer 07/04/2013  . Umbilical hernia 62/22/9798  . Allergic rhinitis 07/29/2008  . Acid reflux 07/04/2008  . 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 ABDOMINAL HYSTERECTOMY  05/04/13   total hysterectomy  . BREAST SURGERY Left 05/27/2011   Left simple mastectomy for benign disease.  Marland Kitchen BREAST SURGERY Right 05/28/2010   Right simple mastectomy/sentinel node biopsy/breast ca  . COLONOSCOPY  2012  . COLONOSCOPY  03/09/2011   Normal exam  . DILATION AND CURETTAGE OF UTERUS    . MASTECTOMY Bilateral 2011,2012  . UPPER GI ENDOSCOPY      Family History        Family Status  Relation Name Status  . Mother  Alive  . Father  Alive  . Sister  Alive  . Brother  Alive  . Sister  Alive  . Sister  Alive  . PGF  (Not Specified)        Her family history includes Arthritis in her mother and sister; Breast cancer in her mother; Cancer in her father and mother; Colon cancer in her paternal grandfather; Colon polyps in her  father; Diabetes in her father; Healthy in her brother, sister, and sister; Hypertension in her father.      Allergies  Allergen Reactions  . Iodinated Diagnostic Agents Swelling    Eye swelling  . Epinephrine   . Neomycin Hives  . Other Other (See Comments)    polymycin - blisters  . Penicillins Itching     Current Outpatient Medications:  .  CALCIUM CARBONATE PO, Take 300 mg by mouth., Disp: , Rfl:  .  cholecalciferol (VITAMIN D) 1000 UNITS tablet, Take 1,000 Units by mouth daily., Disp: , Rfl:  .  meloxicam (MOBIC) 15 MG tablet, Take 1 tablet by mouth as needed., Disp: , Rfl:    Patient Care Team: Virginia Crews, MD as PCP - General (Family Medicine) Bary Castilla Forest Gleason, MD (General Surgery)  Objective:   Vitals: BP 132/80 (BP Location: Left Arm, Patient Position: Sitting, Cuff Size: Normal)   Pulse 76   Temp 98.4 F (36.9 C) (Oral)   Resp 16   Ht _0  (1.651 m)   Wt 151 lb (68.5 kg)   LMP 12/05/2012   SpO2 99%   BMI 25.13 kg/m    Vitals:   11/28/17 0900  BP: 132/80  Pulse: 76  Resp: 16  Temp: 98.4 F (36.9 C)  TempSrc: Oral  SpO2: 99%  Weight: 151 lb (68.5 kg)  Height: _1  (1.651 m)     Physical Exam  Constitutional: She is oriented to person, place, and time. She appears well-developed and well-nourished. No distress.  HENT:  Head: Normocephalic and atraumatic.  Right Ear: External ear normal.  Left Ear: External ear normal.  Nose: Nose normal.  Mouth/Throat: Oropharynx is clear and moist.  Eyes: Pupils are equal, round, and reactive to light. Conjunctivae and EOM are normal. No scleral icterus.  Neck: Neck supple. No thyromegaly present.  Cardiovascular: Normal rate, regular rhythm, normal heart sounds and intact distal pulses.  No murmur heard. Pulmonary/Chest: Effort normal and breath sounds normal. No respiratory distress. She has no wheezes. She has no rales.  Abdominal: Soft. Bowel sounds are normal. She exhibits no distension.  There is no tenderness. There is no rebound and no guarding.  Genitourinary:  Genitourinary Comments: Exam declined today  Musculoskeletal: She exhibits no edema or deformity.  R Knee: Normal to inspection with no erythema or effusion or obvious bony abnormalities.  Mild swelling noted compared to left knee. Palpation normal with no warmth or joint line tenderness or patellar tenderness or condyle tenderness. ROM normal in flexion and extension and lower leg rotation. Ligaments with solid consistent endpoints including ACL, PCL, LCL, MCL. Negative Mcmurray's and provocative meniscal tests. Non painful patellar compression. Patellar and quadriceps tendons unremarkable. Hamstring and quadriceps strength is normal.   Lymphadenopathy:    She has no cervical adenopathy.  Neurological: She is alert and oriented to person, place, and time.  Skin: Skin is warm and dry. Capillary refill takes less than 2 seconds. No rash noted.  Psychiatric: She has a normal mood and affect. Her behavior is normal.  Vitals reviewed.    Depression Screen PHQ 2/9 Scores 11/28/2017 01/21/2017 06/05/2015  PHQ - 2 Score 0 0 0      Assessment & Plan:     Routine Health Maintenance and Physical Exam  Exercise Activities and Dietary recommendations Goals    . Exercise 150 minutes per week (moderate activity)       Immunization History  Administered Date(s) Administered  . Td 03/11/1997  . Tdap 02/21/2007, 01/21/2017    Health Maintenance  Topic Date Due  . HIV Screening  08/03/1975  . INFLUENZA VACCINE  01/12/2018  . COLONOSCOPY  03/08/2021  . TETANUS/TDAP  01/22/2027  . Hepatitis C Screening  Completed     Discussed health benefits of physical activity, and encouraged her to engage in regular exercise appropriate for her age and condition.    -------------------------------------------------------------------- Problem List Items Addressed This Visit      Genitourinary   Vaginal atrophy     Description is consistent with vaginal atrophy after total hysterectomy and BSO Patient does not have any contraindications to topical estrogen therapy she is status post hysterectomy and did not have ER/PR positive breast cancer Start Estrace cream Use daily at bedtime for 1 to 2 weeks and then switch to 2-3  times weekly Return precautions discussed        Other   Hypercholesterolemia   Relevant Orders   Lipid panel   Comprehensive metabolic panel   Neutropenia (HCC)   Relevant Orders   CBC   Avitaminosis D   Relevant Orders   VITAMIN D 25 Hydroxy (Vit-D Deficiency, Fractures)   Acute pain of right knee    New problem Suspect OA, possibly underneath the patella Get x-rays to evaluate degree of OA Continue meloxicam and add Tylenol twice daily Could consider corticosteroid injection in the future for pain control Discussed ice, elevation, avoiding movements that hurt, and staying active No evidence of instability, ligamental injury, or meniscal injury      Relevant Orders   DG Knee Complete 4 Views Right    Other Visit Diagnoses    Encounter for annual physical exam    -  Primary   Screening for HIV (human immunodeficiency virus)       Relevant Orders   HIV antibody (with reflex)       Return in about 1 year (around 11/29/2018) for CPE.   The entirety of the information documented in the History of Present Illness, Review of Systems and Physical Exam were personally obtained by me. Portions of this information were initially documented by Raquel Sarna Ratchford, CMA and reviewed by me for thoroughness and accuracy.    Virginia Crews, MD, MPH [facility] 11/28/2017 9:45 AM

## 2017-11-28 NOTE — Assessment & Plan Note (Signed)
New problem Suspect OA, possibly underneath the patella Get x-rays to evaluate degree of OA Continue meloxicam and add Tylenol twice daily Could consider corticosteroid injection in the future for pain control Discussed ice, elevation, avoiding movements that hurt, and staying active No evidence of instability, ligamental injury, or meniscal injury

## 2017-11-28 NOTE — Assessment & Plan Note (Signed)
Description is consistent with vaginal atrophy after total hysterectomy and BSO Patient does not have any contraindications to topical estrogen therapy she is status post hysterectomy and did not have ER/PR positive breast cancer Start Estrace cream Use daily at bedtime for 1 to 2 weeks and then switch to 2-3 times weekly Return precautions discussed

## 2017-11-28 NOTE — Patient Instructions (Signed)
Atrophic Vaginitis Atrophic vaginitis is when the tissues that line the vagina become dry and thin. This is caused by a drop in estrogen. Estrogen helps:  To keep the vagina moist.  To make a clear fluid that helps: ? To lubricate the vagina for sex. ? To protect the vagina from infection.  If the lining of the vagina is dry and thin, it may:  Make sex painful. It may also cause bleeding.  Cause a feeling of: ? Burning. ? Irritation. ? Itchiness.  Make an exam of your vagina painful. It may also cause bleeding.  Make you lose interest in sex.  Cause a burning feeling when you pee.  Make your vaginal fluid (discharge) brown or yellow.  For some women, there are no symptoms. This condition is most common in women who do not get their regular menstrual periods anymore (menopause). This often starts when a woman is 45-55 years old. Follow these instructions at home:  Take medicines only as told by your doctor. Do not use any herbal or alternative medicines unless your doctor says it is okay.  Use over-the-counter products for dryness only as told by your doctor. These include: ? Creams. ? Lubricants. ? Moisturizers.  Do not douche.  Do not use products that can make your vagina dry. These include: ? Scented feminine sprays. ? Scented tampons. ? Scented soaps.  If it hurts to have sex, tell your sexual partner. Contact a doctor if:  Your discharge looks different than normal.  Your vagina has an unusual smell.  You have new symptoms.  Your symptoms do not get better with treatment.  Your symptoms get worse. This information is not intended to replace advice given to you by your health care provider. Make sure you discuss any questions you have with your health care provider. Document Released: 11/17/2007 Document Revised: 11/06/2015 Document Reviewed: 05/22/2014 Elsevier Interactive Patient Education  2018 Elsevier Inc[employer] Preventive Care 40-64 Years,  Female Preventive care refers to lifestyle choices and visits with your health care provider that can promote health and wellness. What does preventive care include?  A yearly physical exam. This is also called an annual well check.  Dental exams once or twice a year.  Routine eye exams. Ask your health care provider how often you should have your eyes checked.  Personal lifestyle choices, including: ? Daily care of your teeth and gums. ? Regular physical activity. ? Eating a healthy diet. ? Avoiding tobacco and drug use. ? Limiting alcohol use. ? Practicing safe sex. ? Taking low-dose aspirin daily starting at age 50. ? Taking vitamin and mineral supplements as recommended by your health care provider. What happens during an annual well check? The services and screenings done by your health care provider during your annual well check will depend on your age, overall health, lifestyle risk factors, and family history of disease. Counseling Your health care provider may ask you questions about your:  Alcohol use.  Tobacco use.  Drug use.  Emotional well-being.  Home and relationship well-being.  Sexual activity.  Eating habits.  Work and work environment.  Method of birth control.  Menstrual cycle.  Pregnancy history.  Screening You may have the following tests or measurements:  Height, weight, and BMI.  Blood pressure.  Lipid and cholesterol levels. These may be checked every 5 years, or more frequently if you are over 50 years old.  Skin check.  Lung cancer screening. You may have this screening every year starting at age 55   if you have a 30-pack-year history of smoking and currently smoke or have quit within the past 15 years.  Fecal occult blood test (FOBT) of the stool. You may have this test every year starting at age 50.  Flexible sigmoidoscopy or colonoscopy. You may have a sigmoidoscopy every 5 years or a colonoscopy every 10 years starting at age  50.  Hepatitis C blood test.  Hepatitis B blood test.  Sexually transmitted disease (STD) testing.  Diabetes screening. This is done by checking your blood sugar (glucose) after you have not eaten for a while (fasting). You may have this done every 1-3 years.  Mammogram. This may be done every 1-2 years. Talk to your health care provider about when you should start having regular mammograms. This may depend on whether you have a family history of breast cancer.  BRCA-related cancer screening. This may be done if you have a family history of breast, ovarian, tubal, or peritoneal cancers.  Pelvic exam and Pap test. This may be done every 3 years starting at age 21. Starting at age 30, this may be done every 5 years if you have a Pap test in combination with an HPV test.  Bone density scan. This is done to screen for osteoporosis. You may have this scan if you are at high risk for osteoporosis.  Discuss your test results, treatment options, and if necessary, the need for more tests with your health care provider. Vaccines Your health care provider may recommend certain vaccines, such as:  Influenza vaccine. This is recommended every year.  Tetanus, diphtheria, and acellular pertussis (Tdap, Td) vaccine. You may need a Td booster every 10 years.  Varicella vaccine. You may need this if you have not been vaccinated.  Zoster vaccine. You may need this after age 60.  Measles, mumps, and rubella (MMR) vaccine. You may need at least one dose of MMR if you were born in 1957 or later. You may also need a second dose.  Pneumococcal 13-valent conjugate (PCV13) vaccine. You may need this if you have certain conditions and were not previously vaccinated.  Pneumococcal polysaccharide (PPSV23) vaccine. You may need one or two doses if you smoke cigarettes or if you have certain conditions.  Meningococcal vaccine. You may need this if you have certain conditions.  Hepatitis A vaccine. You may  need this if you have certain conditions or if you travel or work in places where you may be exposed to hepatitis A.  Hepatitis B vaccine. You may need this if you have certain conditions or if you travel or work in places where you may be exposed to hepatitis B.  Haemophilus influenzae type b (Hib) vaccine. You may need this if you have certain conditions.  Talk to your health care provider about which screenings and vaccines you need and how often you need them. This information is not intended to replace advice given to you by your health care provider. Make sure you discuss any questions you have with your health care provider. Document Released: 06/27/2015 Document Revised: 02/18/2016 Document Reviewed: 04/01/2015 Elsevier Interactive Patient Education  2018 Elsevier Inc.  

## 2017-11-29 ENCOUNTER — Ambulatory Visit
Admission: RE | Admit: 2017-11-29 | Discharge: 2017-11-29 | Disposition: A | Discharge status: Home / Self Care | Payer: Commercial Managed Care - PPO | Source: Ambulatory Visit | Attending: Family Medicine | Admitting: Family Medicine

## 2017-11-29 ENCOUNTER — Telehealth: Payer: Self-pay

## 2017-11-29 DIAGNOSIS — M25561 Pain in right knee: Secondary | ICD-10-CM

## 2017-11-29 LAB — COMPREHENSIVE METABOLIC PANEL
A/G RATIO: 1.9 (ref 1.2–2.2)
ALT: 13 IU/L (ref 0–32)
AST: 14 IU/L (ref 0–40)
Albumin: 4.3 g/dL (ref 3.5–5.5)
Alkaline Phosphatase: 120 IU/L — ABNORMAL HIGH (ref 39–117)
BILIRUBIN TOTAL: 0.5 mg/dL (ref 0.0–1.2)
BUN/Creatinine Ratio: 22 (ref 9–23)
BUN: 14 mg/dL (ref 6–24)
CALCIUM: 9.6 mg/dL (ref 8.7–10.2)
CHLORIDE: 104 mmol/L (ref 96–106)
CO2: 25 mmol/L (ref 20–29)
Creatinine, Ser: 0.64 mg/dL (ref 0.57–1.00)
GFR, EST AFRICAN AMERICAN: 115 mL/min/{1.73_m2} (ref >59)
GFR, EST NON AFRICAN AMERICAN: 99 mL/min/{1.73_m2} (ref >59)
GLOBULIN, TOTAL: 2.3 g/dL (ref 1.5–4.5)
Glucose: 82 mg/dL (ref 65–99)
POTASSIUM: 4.5 mmol/L (ref 3.5–5.2)
SODIUM: 142 mmol/L (ref 134–144)
Total Protein: 6.6 g/dL (ref 6.0–8.5)

## 2017-11-29 LAB — CBC
Hematocrit: 38.3 % (ref 34.0–46.6)
Hemoglobin: 12.6 g/dL (ref 11.1–15.9)
MCH: 29.2 pg (ref 26.6–33.0)
MCHC: 32.9 g/dL (ref 31.5–35.7)
MCV: 89 fL (ref 79–97)
PLATELETS: 278 10*3/uL (ref 150–450)
RBC: 4.32 x10E6/uL (ref 3.77–5.28)
RDW: 13.7 % (ref 12.3–15.4)
WBC: 4 10*3/uL (ref 3.4–10.8)

## 2017-11-29 LAB — VITAMIN D 25 HYDROXY (VIT D DEFICIENCY, FRACTURES): Vit D, 25-Hydroxy: 39.2 ng/mL (ref 30.0–100.0)

## 2017-11-29 LAB — LIPID PANEL
CHOL/HDL RATIO: 3.4 ratio (ref 0.0–4.4)
Cholesterol, Total: 239 mg/dL — ABNORMAL HIGH (ref 100–199)
HDL: 70 mg/dL (ref >39)
LDL Calculated: 148 mg/dL — ABNORMAL HIGH (ref 0–99)
Triglycerides: 104 mg/dL (ref 0–149)
VLDL CHOLESTEROL CAL: 21 mg/dL (ref 5–40)

## 2017-11-29 LAB — HIV ANTIBODY (ROUTINE TESTING W REFLEX): HIV SCREEN 4TH GENERATION: NONREACTIVE

## 2017-11-29 NOTE — Telephone Encounter (Signed)
Didn't see this other results when I call the patient can you please let the patient know Raquel Sarna when you call her back with the response about the Meloxicam please.  Thanks,  -Jasher Barkan

## 2017-11-29 NOTE — Telephone Encounter (Signed)
-----   Message from Virginia Crews, MD sent at 11/29/2017  9:09 AM EDT ----- Radiologist reported minimal arthritis and loss of joint space in medial knee.  When I reviewed images, also noted loss of space between patella and rest of knee.  I think there is some arthritis of the back of the knee cap.  Virginia Crews, MD, MPH [facility] 11/29/2017 9:09 AM

## 2017-11-29 NOTE — Telephone Encounter (Signed)
-----   Message from Virginia Crews, MD sent at 11/29/2017  9:12 AM EDT ----- Cholesterol is elevated, but 10 year risk of heart disease/stroke is low at 2.5%.  Normal kidney function, liver function, electrolytes, Blood counts, Vit D level.  No changes to medications.  Negative HIV screening.  Virginia Crews, MD, MPH [facility] 11/29/2017 9:11 AM

## 2017-11-29 NOTE — Telephone Encounter (Signed)
Patient advised as directed below. Patient is asking what is there for her to do? Should she continue with the Meloxicam?  Please advise.  Thanks,  -Joseline

## 2017-11-30 NOTE — Telephone Encounter (Signed)
Yes.  We treat arthritis with Meloxicam.  Continue to use this for pain control.  As we discussed during visit, could consider steroid injection in the future if needed.  Virginia Crews, MD, MPH [facility] 11/30/2017 10:13 AM

## 2017-11-30 NOTE — Telephone Encounter (Signed)
Pt advised.

## 2018-02-27 ENCOUNTER — Other Ambulatory Visit: Payer: Self-pay | Admitting: Family Medicine

## 2018-03-24 ENCOUNTER — Other Ambulatory Visit: Payer: Self-pay | Admitting: Otolaryngology

## 2018-03-24 DIAGNOSIS — H9312 Tinnitus, left ear: Secondary | ICD-10-CM

## 2018-04-10 ENCOUNTER — Ambulatory Visit
Admission: RE | Admit: 2018-04-10 | Discharge: 2018-04-10 | Disposition: A | Discharge status: Home / Self Care | Payer: Commercial Managed Care - PPO | Source: Ambulatory Visit | Attending: Otolaryngology | Admitting: Otolaryngology

## 2018-04-10 DIAGNOSIS — H9312 Tinnitus, left ear: Secondary | ICD-10-CM

## 2018-04-10 MED ORDER — GADOBUTROL 1 MMOL/ML IV SOLN
6.5000 mL | Freq: Once | INTRAVENOUS | Status: AC | PRN
  Administered 2018-04-10: 6.5 mL via INTRAVENOUS

## 2018-04-17 DIAGNOSIS — M5416 Radiculopathy, lumbar region: Secondary | ICD-10-CM | POA: Insufficient documentation

## 2018-07-04 ENCOUNTER — Ambulatory Visit: Payer: Commercial Managed Care - PPO | Admitting: General Surgery

## 2018-07-05 ENCOUNTER — Ambulatory Visit: Payer: Commercial Managed Care - PPO | Admitting: General Surgery

## 2018-07-05 ENCOUNTER — Other Ambulatory Visit: Payer: Self-pay

## 2018-07-05 ENCOUNTER — Encounter: Payer: Self-pay | Admitting: General Surgery

## 2018-07-05 VITALS — BP 159/75 | HR 82 | Temp 97.7°F | Resp 14 | Ht 65.0 in | Wt 155.0 lb

## 2018-07-05 DIAGNOSIS — Z853 Personal history of malignant neoplasm of breast: Secondary | ICD-10-CM | POA: Diagnosis not present

## 2018-07-05 NOTE — Patient Instructions (Signed)
The patient is aware to call back for any questions or new concerns.  

## 2018-07-05 NOTE — Progress Notes (Signed)
Donna Hoffman is a 58 year old female patient of Dr. Dwyane Luo.  She had ER/PR negative, HER-2 positive breast cancer in the right breast and underwent a mastectomy.  This was in 2011; she had a prophylactic left mastectomy 1 year later.  She has not had any reconstruction, nor did she have any hormonal or chemotherapy.  1 year ago, she had a node in her right axilla that was excised due to enlargement.  Final pathology was benign.  She is here for annual follow-up, per her request.  She states that she has been doing well, and has no specific concerns today  Past Medical History:  Diagnosis Date  . 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  Plan adjuvant chemotherapy.  Marland Kitchen GERD (gastroesophageal reflux disease)   . Heart murmur   . Malignant neoplasm of breast (female), unspecified site 05/11/2010   Invasive ductal carcinoma, extensive DCIS. BRCA negative/ right breast   Past Surgical History:  Procedure Laterality Date  . ABDOMINAL HYSTERECTOMY  05/04/13   total hysterectomy  . BREAST SURGERY Left 05/27/2011   Left simple mastectomy for benign disease.  Marland Kitchen BREAST SURGERY Right 05/28/2010   Right simple mastectomy/sentinel node biopsy/breast ca  . COLONOSCOPY  2012  . COLONOSCOPY  03/09/2011   Normal exam  . DILATION AND CURETTAGE OF UTERUS    . MASTECTOMY Bilateral 2011,2012  . UPPER GI ENDOSCOPY     Family History  Problem Relation Age of Onset  . Breast cancer Mother        x2  . Arthritis Mother   . Cancer Mother        breast  . Colon polyps Father   . Diabetes Father   . Hypertension Father   . Cancer Father        skin  . Arthritis Sister   . Healthy Brother   . Healthy Sister   . Healthy Sister   . Colon cancer Paternal Grandfather    Social History   Tobacco Use  . Smoking status: Former Smoker    Years: 0.00  . Smokeless tobacco: Never Used  . Tobacco comment: was previously social  smoker  Substance Use Topics  . Alcohol use: No  . Drug use: No   Current Meds  Medication Sig  . CALCIUM CARBONATE PO Take 300 mg by mouth.  . cholecalciferol (VITAMIN D) 1000 UNITS tablet Take 1,000 Units by mouth daily.  Marland Kitchen estradiol (ESTRACE) 0.1 MG/GM vaginal cream PLACE 1 APPLICATORFUL VAGINALLY 3 (THREE) TIMES A WEEK. AT BEDTIME  . meloxicam (MOBIC) 15 MG tablet Take 1 tablet by mouth as needed.   Allergies  Allergen Reactions  . Iodinated Diagnostic Agents Swelling    Eye swelling  . Epinephrine   . Neomycin Hives  . Other Other (See Comments)    polymycin - blisters  . Penicillins Itching   Vitals:   07/05/18 1553  BP: (!) 159/75  Pulse: 82  Resp: 14  Temp: 97.7 F (36.5 C)  SpO2: 98%   Physical Exam  Constitutional: She appears well-developed and well-nourished. No distress.  HENT:  Head: Normocephalic and atraumatic.  Mouth/Throat: Oropharynx is clear and moist.  Eyes: Pupils are equal, round, and reactive to light. Right eye exhibits no discharge. Left eye exhibits no discharge. No scleral icterus.  Neck: Normal range of motion.  Cardiovascular: Normal rate.  Respiratory: Effort normal.    Bilateral mastectomies.  Well-healed, no masses  on chest wall.   Lymphadenopathy:    She has no cervical adenopathy.       Right axillary: No pectoral and no lateral adenopathy present.       Left axillary: No pectoral and no lateral adenopathy present.      Right: No supraclavicular adenopathy present.       Left: No supraclavicular adenopathy present.    Data reviewed: I reviewed Dr. Dwyane Luo progress note from January 2019, along with the pathology demonstrating a benign lymph node.  A/P: This is a 58 year old woman who had a stage I breast cancer with extensive DCIS.  She has had bilateral mastectomies.  She has had no evidence of recurrent disease, and states that the oncologists have released her from their care.  [name] that perhaps she did not  continue to need regular surgical follow-up, either.  She stated that she prefers to continue to be seen on an annual basis.  We will make arrangements for her to be seen by Dr. Bary Castilla in 1 years time. She was provided with a new prescription for prostheses and bras for the year.

## 2018-08-17 ENCOUNTER — Encounter: Payer: Self-pay | Admitting: General Surgery

## 2018-08-17 ENCOUNTER — Ambulatory Visit (INDEPENDENT_AMBULATORY_CARE_PROVIDER_SITE_OTHER): Payer: Commercial Managed Care - PPO | Admitting: General Surgery

## 2018-08-17 ENCOUNTER — Other Ambulatory Visit: Payer: Self-pay

## 2018-08-17 VITALS — BP 117/73 | HR 76 | Temp 97.7°F | Ht 65.0 in | Wt 155.6 lb

## 2018-08-17 DIAGNOSIS — Z853 Personal history of malignant neoplasm of breast: Secondary | ICD-10-CM | POA: Diagnosis not present

## 2018-08-17 NOTE — Patient Instructions (Addendum)
The patient is aware to call back for any questions or new concerns. Follow up in one year. She will call sooner if her hernia becomes symptomatic.  Hernia, Adult     A hernia is the bulging of an organ or tissue through a weak spot in the muscles of the abdomen (abdominal wall). Hernias develop most often near the belly button (navel) or the area where the leg meets the lower abdomen (groin). Common types of hernias include:  Incisional hernia. This type bulges through a scar from an abdominal surgery.  Umbilical hernia. This type develops near the navel.  Inguinal hernia. This type develops in the groin or scrotum.  Femoral hernia. This type develops under the groin, in the upper thigh area.  Hiatal hernia. This type occurs when part of the stomach slides above the muscle that separates the abdomen from the chest (diaphragm). What are the causes? This condition may be caused by:  Heavy lifting.  Coughing over a long period of time.  Straining to have a bowel movement. Constipation can lead to straining.  An incision made during an abdominal surgery.  A physical problem that is present at birth (congenital defect).  Being overweight or obese.  Smoking.  Excess fluid in the abdomen.  Undescended testicles in males. What are the signs or symptoms? The main symptom is a skin-colored, rounded bulge in the area of the hernia. However, a bulge may not always be present. It may grow bigger or be more visible when you cough or strain (such as when lifting something heavy). A hernia that can be pushed back into the area (is reducible) rarely causes pain. A hernia that cannot be pushed back into the area (is incarcerated) may lose its blood supply (become strangulated). A hernia that is incarcerated may cause:  Pain.  Fever.  Nausea and vomiting.  Swelling.  Constipation. How is this diagnosed? A hernia may be diagnosed based on:  Your symptoms and medical history.  A  physical exam. Your health care provider may ask you to cough or move in certain ways to see if the hernia becomes visible.  Imaging tests, such as: ? X-rays. ? Ultrasound. ? CT scan. How is this treated? A hernia that is small and painless may not need to be treated. A hernia that is large or painful may be treated with surgery. Inguinal hernias may be treated with surgery to prevent incarceration or strangulation. Strangulated hernias are always treated with surgery because a lack of blood supply to the trapped organ or tissue can cause it to die. Surgery to treat a hernia involves pushing the bulge back into place and repairing the weak area of the muscle or abdominal wall. Follow these instructions at home: Activity  Avoid straining.  Do not lift anything that is heavier than 10 lb (4.5 kg), or the limit that you are told, until your health care provider says that it is safe.  When lifting heavy objects, lift with your leg muscles, not your back muscles. Preventing constipation  Take actions to prevent constipation. Constipation leads to straining with bowel movements, which can make a hernia worse or cause a hernia repair to break down. Your health care provider may recommend that you: ? Drink enough fluid to keep your urine pale yellow. ? Eat foods that are high in fiber, such as fresh fruits and vegetables, whole grains, and beans. ? Limit foods that are high in fat and processed sugars, such as fried or sweet foods. ?  Take an over-the-counter or prescription medicine for constipation. General instructions  When coughing, try to cough gently.  You may try to push the hernia back in place by very gently pressing on it while lying down. Do not try to force the bulge back in if it will not push in easily.  If you are overweight, work with your health care provider to lose weight safely.  Do not use any products that contain nicotine or tobacco, such as cigarettes and e-cigarettes.  If you need help quitting, ask your health care provider.  If you are scheduled for hernia repair, watch your hernia for any changes in shape, size, or color. Tell your health care provider about any changes or new symptoms.  Take over-the-counter and prescription medicines only as told by your health care provider.  Keep all follow-up visits as told by your health care provider. This is important. Contact a health care provider if:  You develop new pain, swelling, or redness around your hernia.  You have signs of constipation, such as: ? Fewer bowel movements in a week than normal. ? Difficulty having a bowel movement. ? Stools that are dry, hard, or larger than normal. Get help right away if:  You have a fever.  You have abdomen pain that gets worse.  You feel nauseous or you vomit.  You cannot push the hernia back in place by very gently pressing on it while lying down. Do not try to force the bulge back in if it will not push in easily.  The hernia: ? Changes in shape, size, or color. ? Feels hard or tender. These symptoms may represent a serious problem that is an emergency. Do not wait to see if the symptoms will go away. Get medical help right away. Call your local emergency services (911 in the [place] Summary  A hernia is the bulging of an organ or tissue through a weak spot in the muscles of the abdomen (abdominal wall).  The main symptom is a skin-colored, rounded lump (bulge) in the hernia area. However, a bulge may not always be present. It may grow bigger or more visible when you cough or strain (such as when having a bowel movement).  A hernia that is small and painless may not need to be treated. A hernia that is large or painful may be treated with surgery.  Surgery to treat a hernia involves pushing the bulge back into place and repairing the weak part of the abdomen. This information is not intended to replace advice given to you by your health care provider.  Make sure you discuss any questions you have with your health care provider. Document Released: 05/31/2005 Document Revised: 03/02/2017 Document Reviewed: 03/02/2017 Elsevier Interactive Patient Education  2019 [employer].

## 2018-08-17 NOTE — Progress Notes (Signed)
Patient ID: Donna Hoffman, female   DOB: [birth year], 58 y.o.   MRN: [id number]  Chief Complaint  Patient presents with  . Follow-up    history of breast cancer    HPI Donna Hoffman is a 58 y.o. female here today for breast exam follow up, bilateral mastectomy. Patient states she has no concerns at this time. Occasionally, she will see a small "whelp" at the right chest wall incision but it does not last.  HPI  Past Medical History:  Diagnosis Date  . 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  Plan adjuvant chemotherapy.  Marland Kitchen GERD (gastroesophageal reflux disease)   . Heart murmur   . Malignant neoplasm of breast (female), unspecified site 05/11/2010   Invasive ductal carcinoma, extensive DCIS. BRCA negative/ right breast    Past Surgical History:  Procedure Laterality Date  . ABDOMINAL HYSTERECTOMY  05/04/13   total hysterectomy  . BREAST SURGERY Left 05/27/2011   Left simple mastectomy for benign disease.  Marland Kitchen BREAST SURGERY Right 05/28/2010   Right simple mastectomy/sentinel node biopsy/breast ca  . COLONOSCOPY  2012  . COLONOSCOPY  03/09/2011   Normal exam  . DILATION AND CURETTAGE OF UTERUS    . MASTECTOMY Bilateral 2011,2012  . UPPER GI ENDOSCOPY      Family History  Problem Relation Age of Onset  . Breast cancer Mother        x2  . Arthritis Mother   . Cancer Mother        breast  . Colon polyps Father   . Diabetes Father   . Hypertension Father   . Cancer Father        skin  . Arthritis Sister   . Healthy Brother   . Healthy Sister   . Healthy Sister   . Colon cancer Paternal Grandfather     Social History Social History   Tobacco Use  . Smoking status: Former Smoker    Years: 0.00  . Smokeless tobacco: Never Used  . Tobacco comment: was previously social smoker  Substance Use Topics  . Alcohol use: No  . Drug use: No    Allergies  Allergen Reactions  . Iodinated  Diagnostic Agents Swelling    Eye swelling  . Epinephrine   . Neomycin Hives  . Other Other (See Comments)    polymycin - blisters  . Penicillins Itching    Current Outpatient Medications  Medication Sig Dispense Refill  . CALCIUM CARBONATE PO Take 300 mg by mouth.    . cholecalciferol (VITAMIN D) 1000 UNITS tablet Take 1,000 Units by mouth daily.    Marland Kitchen estradiol (ESTRACE) 0.1 MG/GM vaginal cream PLACE 1 APPLICATORFUL VAGINALLY 3 (THREE) TIMES A WEEK. AT BEDTIME 127.5 g 5  . meloxicam (MOBIC) 15 MG tablet Take 1 tablet by mouth as needed.     No current facility-administered medications for this visit.     Review of Systems Review of Systems  Constitutional: Negative.   Respiratory: Negative.   Cardiovascular: Negative.     Blood pressure 117/73, pulse 76, temperature 97.7 F (36.5 C), temperature source Temporal, height '5\' 5"'$  (1.651 m), weight 155 lb 9.6 oz (70.6 kg), last menstrual period 12/05/2012, SpO2 96 %.  Physical Exam Physical Exam Exam conducted with a chaperone present.  Constitutional:      Appearance: She is well-developed.  Eyes:     General: No scleral icterus.  Conjunctiva/sclera: Conjunctivae normal.  Neck:     Musculoskeletal: Normal range of motion and neck supple.  Cardiovascular:     Rate and Rhythm: Normal rate and regular rhythm.     Heart sounds: Normal heart sounds.  Pulmonary:     Effort: Pulmonary effort is normal.     Breath sounds: Normal breath sounds.  Chest:     Breasts: Breasts are symmetrical.        Right: No inverted nipple, mass, nipple discharge, skin change or tenderness.        Left: No inverted nipple, mass, nipple discharge, skin change or tenderness.    Abdominal:     Hernia: A hernia is present. Hernia is present in the umbilical area.    Lymphadenopathy:     Cervical: No cervical adenopathy.     Upper Body:     Right upper body: No supraclavicular or axillary adenopathy.     Left upper body: No supraclavicular  or axillary adenopathy.  Skin:    General: Skin is warm and dry.  Neurological:     Mental Status: She is alert and oriented to person, place, and time.  Psychiatric:        Behavior: Behavior normal.     Data Reviewed None.  Assessment No evidence of recurrent breast cancer.  Plan  The patient desires to follow-up annually in this office.  She will call sooner if her hernia becomes symptomatic.    The patient is aware to call back for any questions or new concerns.    HPI, assessment, plan and physical exam has been scribed under the direction and in the presence of Robert Bellow, MD. Karie Fetch, RN  HPI, Physical Exam, Assessment and Plan have been scribed under the direction and in the presence of Hervey Ard, Md.  Eudelia Bunch R. Bobette Mo, CMA  I have completed the exam and reviewed the above documentation for accuracy and completeness.  I agree with the above.  Haematologist has been used and any errors in dictation or transcription are unintentional.  Hervey Ard, M.D., F.A.C.S.  Forest Gleason Ziah Turvey 08/18/2018, 6:34 AM

## 2018-09-01 DIAGNOSIS — D2261 Melanocytic nevi of right upper limb, including shoulder: Secondary | ICD-10-CM | POA: Diagnosis not present

## 2018-09-01 DIAGNOSIS — D225 Melanocytic nevi of trunk: Secondary | ICD-10-CM | POA: Diagnosis not present

## 2018-09-01 DIAGNOSIS — D2262 Melanocytic nevi of left upper limb, including shoulder: Secondary | ICD-10-CM | POA: Diagnosis not present

## 2018-10-31 ENCOUNTER — Encounter: Payer: Self-pay | Admitting: General Surgery

## 2018-10-31 ENCOUNTER — Telehealth: Payer: Self-pay | Admitting: *Deleted

## 2018-10-31 NOTE — Progress Notes (Addendum)
Mammograms recently completed at [facility] reviewed with the patient at her request. Normal study, BIRAD 1.   Above entered in error, wrong patient.  Phone follow-up with patient regarding her umbilical hernia.  Has been increasingly symptomatic since March.  Very small defect without no suggested of obstruction.  Aware of the delaying elective surgery at this time.  Desires surgery to be on January 29, 2019.  Dissipate less than 1 week downtime.  Preoperative visit will not be required.  Thank you

## 2018-10-31 NOTE — Telephone Encounter (Signed)
Patient wishes to get umbilical hernia repair scheduled for 01-29-19 at [facility] with Dr. Bary Castilla.   The patient is aware that elective surgeries are on hold again due to COVID-19.   Patient aware we will call her back once restrictions are lifted.   She will not require a pre-op visit with Dr. Bary Castilla. History and physical will be updated the morning of procedure.   Patient also aware she will need to have COVID-19 testing on 01-25-19 at [facility] building if surgery date is 01-29-19. She is aware she will need to isolate after testing until surgery.

## 2018-11-09 ENCOUNTER — Telehealth: Payer: Self-pay | Admitting: General Surgery

## 2018-11-09 NOTE — Telephone Encounter (Signed)
Patient is calling to find out when she would be able to have her surgery reschedule. She said she could be reached on her cell phone after 11 tomorrow. Please call and advise.

## 2018-11-10 NOTE — Telephone Encounter (Signed)
Patient's surgery to be scheduled for 01-29-19 at [facility] with Dr. Bary Castilla.  The patient is aware to have COVID-19 testing done on 01-25-19 at the [facility] drive thru ([address] [place]) between 10:30 am and 12:30 pm. She is aware to isolate after, have no visitors, wash hands frequently, and avoid touching face.   The patient is aware she will be contacted by the [facility] to complete a phone interview on 01-22-19.  She is aware to check in at the [facility] entrance where she will be screened for the coronavirus and then sent to Same Day Surgery.   The patient verbalizes understanding of the above.   The patient is aware to call the office should she have further questions.   Instructions were also mailed per patient's request as she was driving at the time of the phone call.

## 2018-11-14 ENCOUNTER — Other Ambulatory Visit: Payer: Self-pay | Admitting: General Surgery

## 2018-12-08 ENCOUNTER — Encounter: Payer: Self-pay | Admitting: Family Medicine

## 2019-01-02 ENCOUNTER — Telehealth: Payer: Self-pay | Admitting: *Deleted

## 2019-01-02 NOTE — Telephone Encounter (Signed)
Patient called and wants to get an estimate on what her surgery will cost.

## 2019-01-02 NOTE — Telephone Encounter (Signed)
Patient was contacted today and notified that Dr. Bary Castilla is no longer with the practice.   The patient was scheduled for umbilical hernia repair with him on 01-29-19.   I did offer to have the patient see another physician with our practice and possibly keep the same O.R. date.   Patient declines further follow up with our office and wishes that surgery be cancelled.   Wells Guiles in the O.R. has been notified of surgery cancellation.

## 2019-01-05 ENCOUNTER — Encounter: Payer: Commercial Managed Care - PPO | Admitting: Physician Assistant

## 2019-01-05 ENCOUNTER — Ambulatory Visit: Payer: Commercial Managed Care - PPO | Admitting: Family Medicine

## 2019-01-08 ENCOUNTER — Encounter: Payer: Self-pay | Admitting: General Surgery

## 2019-01-22 ENCOUNTER — Other Ambulatory Visit: Payer: Commercial Managed Care - PPO

## 2019-01-24 DIAGNOSIS — M7070 Other bursitis of hip, unspecified hip: Secondary | ICD-10-CM | POA: Insufficient documentation

## 2019-01-24 NOTE — Progress Notes (Signed)
Patient: Donna Hoffman, Female    DOB: 08-21-1960, 58 y.o.   MRN: [id number] Visit Date: 01/25/2019  Today's Provider: Lavon Paganini, MD   Chief Complaint  Patient presents with  . Annual Exam   Subjective:  I, Donna Hoffman CMA, am acting as a scribe for Lavon Paganini, MD.    Annual physical exam Donna Hoffman is a 58 y.o. female who presents today for health maintenance and complete physical. She feels well. She reports exercising walking, biking and yoga. She reports she is sleeping well.  ----------------------------------------------------------------- Last Pap:05/04/2013, s/p hysterectomy Last mammogram:04/27/2011, s/p double mastectomy, previously followed by Dr Bary Castilla   Review of Systems  Constitutional: Negative.   HENT: Negative.   Eyes: Negative.   Respiratory: Negative.   Cardiovascular: Negative.   Gastrointestinal: Negative.   Endocrine: Negative.   Genitourinary: Negative.   Musculoskeletal: Negative.   Skin: Negative.   Allergic/Immunologic: Negative.   Neurological: Negative.   Hematological: Negative.   Psychiatric/Behavioral: Negative.     Social History She  reports that she has quit smoking. She quit after 0.00 years of use. She has never used smokeless tobacco. She reports that she does not drink alcohol or use drugs. Social History   Socioeconomic History  . Marital status: Married    Spouse name: Ronalee Belts  . Number of children: 2  . Years of education: some college  . Highest education level: Some college, no degree  Occupational History    Employer: [employer]  Social Needs  . Financial resource strain: Not hard at all  . Food insecurity    Worry: Never true    Inability: Never true  . Transportation needs    Medical: No    Non-medical: No  Tobacco Use  . Smoking status: Former Smoker    Years: 0.00  . Smokeless tobacco: Never Used  . Tobacco comment: was previously social smoker  Substance and Sexual Activity   . Alcohol use: No  . Drug use: No  . Sexual activity: Yes  Lifestyle  . Physical activity    Days per week: 3 days    Minutes per session: 20 min  . Stress: Not on file  Relationships  . Social Herbalist on phone: Not on file    Gets together: Not on file    Attends religious service: Not on file    Active member of club or organization: Not on file    Attends meetings of clubs or organizations: Not on file    Relationship status: Not on file  Other Topics Concern  . Not on file  Social History Narrative  . Not on file    Patient Active Problem List   Diagnosis Date Noted  . Bursitis of hip 01/24/2019  . Lumbar radiculopathy 04/17/2018  . Acute pain of right knee 11/28/2017  . Vaginal atrophy 11/28/2017  . Low back pain 01/21/2017  . Elevated liver enzymes 09/24/2015  . Primary cancer of upper outer quadrant of right female breast ([place]) 06/05/2015  . Hypercholesterolemia 06/05/2015  . Adenitis 06/05/2015  . Neutropenia ([place]) 06/05/2015  . Avitaminosis D 06/05/2015  . History of breast cancer 07/04/2013  . Umbilical hernia 30/86/5784  . Allergic rhinitis 07/29/2008  . Acid reflux 07/04/2008  . 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 ABDOMINAL HYSTERECTOMY  05/04/13   total hysterectomy  . BREAST SURGERY Left 05/27/2011   Left simple  mastectomy for benign disease.  [facility] Kitchen BREAST SURGERY Right 05/28/2010   Right simple mastectomy/sentinel node biopsy/breast ca  . COLONOSCOPY  2012  . COLONOSCOPY  03/09/2011   Normal exam  . DILATION AND CURETTAGE OF UTERUS    . MASTECTOMY Bilateral 2011,2012  . UPPER GI ENDOSCOPY      Family History  Family Status  Relation Name Status  . Mother  Alive  . Father  Alive  . Sister  Alive  . Brother  Alive  . Sister  Alive  . Sister  Alive  . PGF  (Not Specified)   Her family history includes Arthritis in her mother and sister; Breast cancer in her mother;  Cancer in her father and mother; Colon cancer in her paternal grandfather; Colon polyps in her father; Diabetes in her father; Healthy in her brother, sister, and sister; Hypertension in her father.     Allergies  Allergen Reactions  . Iodinated Diagnostic Agents Swelling    Eye swelling  . Epinephrine   . Neomycin Hives  . Other Other (See Comments)    polymycin - blisters  . Penicillins Itching    Previous Medications   CALCIUM CARBONATE PO    Take 300 mg by mouth.   CHOLECALCIFEROL (VITAMIN D) 1000 UNITS TABLET    Take 1,000 Units by mouth daily.   ESTRADIOL (ESTRACE) 0.1 MG/GM VAGINAL CREAM    PLACE 1 APPLICATORFUL VAGINALLY 3 (THREE) TIMES A WEEK. AT BEDTIME   MELOXICAM (MOBIC) 15 MG TABLET    Take 1 tablet by mouth as needed.    Patient Care Team: Virginia Crews, MD as PCP - General (Family Medicine) Bary Castilla Forest Gleason, MD (General Surgery)      Objective:   Vitals: BP 115/70 (BP Location: Left Arm, Patient Position: Sitting, Cuff Size: Normal)   Pulse 87   Temp 98.8 F (37.1 C) (Oral)   Ht 5\' 5"  (1.651 m)   Wt 152 lb 12.8 oz (69.3 kg)   LMP 12/05/2012   SpO2 95%   BMI 25.43 kg/m    Physical Exam Vitals signs reviewed.  Constitutional:      General: She is not in acute distress.    Appearance: Normal appearance. She is well-developed. She is not diaphoretic.  HENT:     Head: Normocephalic and atraumatic.     Right Ear: Tympanic membrane, ear canal and external ear normal.     Left Ear: Tympanic membrane, ear canal and external ear normal.  Eyes:     General: No scleral icterus.    Extraocular Movements: Extraocular movements intact.     Conjunctiva/sclera: Conjunctivae normal.     Pupils: Pupils are equal, round, and reactive to light.  Neck:     Musculoskeletal: Neck supple.     Thyroid: No thyromegaly.  Cardiovascular:     Rate and Rhythm: Normal rate and regular rhythm.     Pulses: Normal pulses.     Heart sounds: Normal heart sounds. No  murmur.  Pulmonary:     Effort: Pulmonary effort is normal. No respiratory distress.     Breath sounds: Normal breath sounds. No wheezing or rales.  Abdominal:     General: There is no distension.     Palpations: Abdomen is soft.     Tenderness: There is no abdominal tenderness.  Musculoskeletal:        General: No deformity.     Right lower leg: No edema.     Left lower leg: No edema.  Lymphadenopathy:  Cervical: No cervical adenopathy.  Skin:    General: Skin is warm and dry.     Capillary Refill: Capillary refill takes less than 2 seconds.     Findings: No rash.  Neurological:     Mental Status: She is alert and oriented to person, place, and time. Mental status is at baseline.  Psychiatric:        Mood and Affect: Mood normal.        Behavior: Behavior normal.        Thought Content: Thought content normal.      Depression Screen PHQ 2/9 Scores 01/25/2019 11/28/2017 01/21/2017 06/05/2015  PHQ - 2 Score 0 0 0 0  PHQ- 9 Score 2 - - -      Assessment & Plan:     Routine Health Maintenance and Physical Exam  Exercise Activities and Dietary recommendations Goals    . Exercise 150 minutes per week (moderate activity)       Immunization History  Administered Date(s) Administered  . Td 03/11/1997  . Tdap 02/21/2007, 01/21/2017    Health Maintenance  Topic Date Due  . INFLUENZA VACCINE  01/13/2019  . COLONOSCOPY  03/08/2021  . TETANUS/TDAP  01/22/2027  . Hepatitis C Screening  Completed  . HIV Screening  Completed     Discussed health benefits of physical activity, and encouraged her to engage in regular exercise appropriate for her age and condition.    --------------------------------------------------------------------  Problem List Items Addressed This Visit      Other   Hypercholesterolemia   Relevant Orders   Comprehensive metabolic panel   Lipid panel   Avitaminosis D   Relevant Orders   VITAMIN D 25 Hydroxy (Vit-D Deficiency, Fractures)     Other Visit Diagnoses    Encounter for annual physical exam    -  Primary   Relevant Orders   CBC with Differential/Platelet   Comprehensive metabolic panel   Lipid panel       Return in about 1 year (around 01/25/2020) for CPE.   The entirety of the information documented in the History of Present Illness, Review of Systems and Physical Exam were personally obtained by me. Portions of this information were initially documented by [facility], CMA and reviewed by me for thoroughness and accuracy.    Bacigalupo, Dionne Bucy, MD MPH [facility]

## 2019-01-25 ENCOUNTER — Other Ambulatory Visit: Payer: Commercial Managed Care - PPO

## 2019-01-25 ENCOUNTER — Ambulatory Visit (INDEPENDENT_AMBULATORY_CARE_PROVIDER_SITE_OTHER): Payer: Commercial Managed Care - PPO | Admitting: Family Medicine

## 2019-01-25 ENCOUNTER — Encounter: Payer: Self-pay | Admitting: Family Medicine

## 2019-01-25 ENCOUNTER — Other Ambulatory Visit: Payer: Self-pay

## 2019-01-25 VITALS — BP 115/70 | HR 87 | Temp 98.8°F | Ht 65.0 in | Wt 152.8 lb

## 2019-01-25 DIAGNOSIS — E7800 Pure hypercholesterolemia, unspecified: Secondary | ICD-10-CM

## 2019-01-25 DIAGNOSIS — Z0000 Encounter for general adult medical examination without abnormal findings: Secondary | ICD-10-CM | POA: Diagnosis not present

## 2019-01-25 DIAGNOSIS — E559 Vitamin D deficiency, unspecified: Secondary | ICD-10-CM | POA: Diagnosis not present

## 2019-01-25 NOTE — Patient Instructions (Signed)

## 2019-01-26 LAB — CBC WITH DIFFERENTIAL/PLATELET
Basophils Absolute: 0 10*3/uL (ref 0.0–0.2)
Basos: 0 %
EOS (ABSOLUTE): 0.2 10*3/uL (ref 0.0–0.4)
Eos: 2 %
Hematocrit: 39 % (ref 34.0–46.6)
Hemoglobin: 12.7 g/dL (ref 11.1–15.9)
Immature Grans (Abs): 0 10*3/uL (ref 0.0–0.1)
Immature Granulocytes: 0 %
Lymphocytes Absolute: 1.1 10*3/uL (ref 0.7–3.1)
Lymphs: 13 %
MCH: 29.3 pg (ref 26.6–33.0)
MCHC: 32.6 g/dL (ref 31.5–35.7)
MCV: 90 fL (ref 79–97)
Monocytes Absolute: 0.5 10*3/uL (ref 0.1–0.9)
Monocytes: 6 %
Neutrophils Absolute: 6.9 10*3/uL (ref 1.4–7.0)
Neutrophils: 79 %
Platelets: 275 10*3/uL (ref 150–450)
RBC: 4.34 x10E6/uL (ref 3.77–5.28)
RDW: 12.8 % (ref 11.7–15.4)
WBC: 8.7 10*3/uL (ref 3.4–10.8)

## 2019-01-26 LAB — COMPREHENSIVE METABOLIC PANEL
ALT: 10 IU/L (ref 0–32)
AST: 12 IU/L (ref 0–40)
Albumin/Globulin Ratio: 2.1 (ref 1.2–2.2)
Albumin: 4.4 g/dL (ref 3.8–4.9)
Alkaline Phosphatase: 118 IU/L — ABNORMAL HIGH (ref 39–117)
BUN/Creatinine Ratio: 20 (ref 9–23)
BUN: 15 mg/dL (ref 6–24)
Bilirubin Total: 0.4 mg/dL (ref 0.0–1.2)
CO2: 25 mmol/L (ref 20–29)
Calcium: 9.2 mg/dL (ref 8.7–10.2)
Chloride: 103 mmol/L (ref 96–106)
Creatinine, Ser: 0.74 mg/dL (ref 0.57–1.00)
GFR calc Af Amer: 103 mL/min/{1.73_m2} (ref >59)
GFR calc non Af Amer: 90 mL/min/{1.73_m2} (ref >59)
Globulin, Total: 2.1 g/dL (ref 1.5–4.5)
Glucose: 83 mg/dL (ref 65–99)
Potassium: 4.3 mmol/L (ref 3.5–5.2)
Sodium: 141 mmol/L (ref 134–144)
Total Protein: 6.5 g/dL (ref 6.0–8.5)

## 2019-01-26 LAB — VITAMIN D 25 HYDROXY (VIT D DEFICIENCY, FRACTURES): Vit D, 25-Hydroxy: 32.2 ng/mL (ref 30.0–100.0)

## 2019-01-26 LAB — LIPID PANEL
Chol/HDL Ratio: 3.6 ratio (ref 0.0–4.4)
Cholesterol, Total: 215 mg/dL — ABNORMAL HIGH (ref 100–199)
HDL: 59 mg/dL (ref >39)
LDL Calculated: 132 mg/dL — ABNORMAL HIGH (ref 0–99)
Triglycerides: 122 mg/dL (ref 0–149)
VLDL Cholesterol Cal: 24 mg/dL (ref 5–40)

## 2019-01-29 ENCOUNTER — Ambulatory Visit: Admit: 2019-01-29 | Payer: Commercial Managed Care - PPO | Admitting: General Surgery

## 2019-01-29 SURGERY — REPAIR, HERNIA, UMBILICAL, ADULT
Anesthesia: General

## 2019-03-29 ENCOUNTER — Other Ambulatory Visit: Payer: Self-pay

## 2019-03-29 ENCOUNTER — Encounter: Payer: Self-pay | Admitting: Family Medicine

## 2019-03-29 ENCOUNTER — Ambulatory Visit: Payer: Commercial Managed Care - PPO | Admitting: Family Medicine

## 2019-03-29 VITALS — BP 102/66 | HR 81 | Temp 98.4°F | Wt 154.8 lb

## 2019-03-29 DIAGNOSIS — R002 Palpitations: Secondary | ICD-10-CM | POA: Diagnosis not present

## 2019-03-29 NOTE — Progress Notes (Signed)
Patient: Donna Hoffman Female    DOB: 04-Jun-1961   58 y.o.   MRN: [id number] Visit Date: 03/29/2019  Today's Provider: Lavon Paganini, MD   Chief Complaint  Patient presents with  . Palpitations   Subjective:    I, [name] CMA, am acting as a Education administrator for Lavon Paganini, MD.   Palpitations  The current episode started more than 1 month ago. The problem occurs intermittently. The problem has been gradually improving. Pertinent negatives include no chest fullness, chest pain or irregular heartbeat.  Patient states that when she stopped drinking caffeine on 03/27/2019 and the palpitations stopped.   Reports that she had cardiology work-up in the past (>15 yrs ago) for a similar issue and was told that it was benign.   Allergies  Allergen Reactions  . Iodinated Diagnostic Agents Swelling    Eye swelling  . Epinephrine   . Neomycin Hives  . Other Other (See Comments)    polymycin - blisters  . Penicillins Itching     Current Outpatient Medications:  .  CALCIUM CARBONATE PO, Take 300 mg by mouth., Disp: , Rfl:  .  cholecalciferol (VITAMIN D) 1000 UNITS tablet, Take 1,000 Units by mouth daily., Disp: , Rfl:  .  estradiol (ESTRACE) 0.1 MG/GM vaginal cream, PLACE 1 APPLICATORFUL VAGINALLY 3 (THREE) TIMES A WEEK. AT BEDTIME, Disp: 127.5 g, Rfl: 5 .  meloxicam (MOBIC) 15 MG tablet, Take 1 tablet by mouth as needed., Disp: , Rfl:   Review of Systems  Constitutional: Negative.   Respiratory: Negative.   Cardiovascular: Positive for palpitations. Negative for chest pain.  Genitourinary: Negative.   Neurological: Negative.     Social History   Tobacco Use  . Smoking status: Former Smoker    Years: 0.00  . Smokeless tobacco: Never Used  . Tobacco comment: was previously social smoker  Substance Use Topics  . Alcohol use: No      Objective:   BP 102/66 (BP Location: Left Arm, Patient Position: Sitting, Cuff Size: Normal)   Pulse 81   Temp 98.4 F  (36.9 C) (Oral)   Wt 154 lb 12.8 oz (70.2 kg)   LMP 12/05/2012   SpO2 97%   BMI 25.76 kg/m  Vitals:   03/29/19 0826  BP: 102/66  Pulse: 81  Temp: 98.4 F (36.9 C)  TempSrc: Oral  SpO2: 97%  Weight: 154 lb 12.8 oz (70.2 kg)  Body mass index is 25.76 kg/m.   Physical Exam Vitals signs reviewed.  Constitutional:      General: She is not in acute distress.    Appearance: Normal appearance. She is well-developed. She is not diaphoretic.  HENT:     Head: Normocephalic and atraumatic.  Eyes:     General: No scleral icterus.    Conjunctiva/sclera: Conjunctivae normal.  Neck:     Musculoskeletal: Neck supple.     Thyroid: No thyromegaly.  Cardiovascular:     Rate and Rhythm: Normal rate and regular rhythm.     Pulses: Normal pulses.     Heart sounds: Normal heart sounds. No murmur.  Pulmonary:     Effort: Pulmonary effort is normal. No respiratory distress.     Breath sounds: Normal breath sounds. No wheezing, rhonchi or rales.  Musculoskeletal:     Right lower leg: No edema.     Left lower leg: No edema.  Lymphadenopathy:     Cervical: No cervical adenopathy.  Skin:    General: Skin is warm  and dry.     Capillary Refill: Capillary refill takes less than 2 seconds.     Findings: No rash.  Neurological:     Mental Status: She is alert and oriented to person, place, and time. Mental status is at baseline.  Psychiatric:        Mood and Affect: Mood normal.        Behavior: Behavior normal.      EKG: NSR, non-ischemic, unchanged from previous   Assessment & Plan   Problem List Items Addressed This Visit      Other   Palpitation - Primary    New problem Now resolved after stopping caffeine intake Likely PCs 2/2 caffeine intake Will check TSH, CMP, CBC EKG reassuring Discussed that if it recurs, can consider cardiology referral      Relevant Orders   EKG 12-Lead (Completed)   TSH   Comprehensive metabolic panel   CBC       Return if symptoms worsen  or fail to improve.   The entirety of the information documented in the History of Present Illness, Review of Systems and Physical Exam were personally obtained by me. Portions of this information were initially documented by [facility], CMA and reviewed by me for thoroughness and accuracy.    , Dionne Bucy, MD MPH [facility]

## 2019-03-29 NOTE — Patient Instructions (Signed)

## 2019-03-29 NOTE — Assessment & Plan Note (Addendum)
New problem Now resolved after stopping caffeine intake Likely PCs 2/2 caffeine intake Will check TSH, CMP, CBC EKG reassuring Discussed that if it recurs, can consider cardiology referral

## 2019-03-30 ENCOUNTER — Telehealth: Payer: Self-pay

## 2019-03-30 LAB — COMPREHENSIVE METABOLIC PANEL
ALT: 10 IU/L (ref 0–32)
AST: 14 IU/L (ref 0–40)
Albumin/Globulin Ratio: 1.6 (ref 1.2–2.2)
Albumin: 4.2 g/dL (ref 3.8–4.9)
Alkaline Phosphatase: 121 IU/L — ABNORMAL HIGH (ref 39–117)
BUN/Creatinine Ratio: 22 (ref 9–23)
BUN: 16 mg/dL (ref 6–24)
Bilirubin Total: 0.3 mg/dL (ref 0.0–1.2)
CO2: 26 mmol/L (ref 20–29)
Calcium: 9.5 mg/dL (ref 8.7–10.2)
Chloride: 106 mmol/L (ref 96–106)
Creatinine, Ser: 0.73 mg/dL (ref 0.57–1.00)
GFR calc Af Amer: 105 mL/min/{1.73_m2} (ref >59)
GFR calc non Af Amer: 91 mL/min/{1.73_m2} (ref >59)
Globulin, Total: 2.6 g/dL (ref 1.5–4.5)
Glucose: 71 mg/dL (ref 65–99)
Potassium: 4.4 mmol/L (ref 3.5–5.2)
Sodium: 145 mmol/L — ABNORMAL HIGH (ref 134–144)
Total Protein: 6.8 g/dL (ref 6.0–8.5)

## 2019-03-30 LAB — CBC
Hematocrit: 41.3 % (ref 34.0–46.6)
Hemoglobin: 13 g/dL (ref 11.1–15.9)
MCH: 28.4 pg (ref 26.6–33.0)
MCHC: 31.5 g/dL (ref 31.5–35.7)
MCV: 90 fL (ref 79–97)
Platelets: 303 10*3/uL (ref 150–450)
RBC: 4.58 x10E6/uL (ref 3.77–5.28)
RDW: 13 % (ref 11.7–15.4)
WBC: 5.1 10*3/uL (ref 3.4–10.8)

## 2019-03-30 LAB — TSH: TSH: 0.592 u[IU]/mL (ref 0.450–4.500)

## 2019-03-30 NOTE — Telephone Encounter (Signed)
Patient was advised.  

## 2019-03-30 NOTE — Telephone Encounter (Signed)
No, the sodium level would not contribute to her palpitations

## 2019-03-30 NOTE — Telephone Encounter (Signed)
Patient was advised and wants to know if the slightly elevated sodium level is what causes her palpitations. Please advise.

## 2019-03-30 NOTE — Telephone Encounter (Signed)
-----   Message from Virginia Crews, MD sent at 03/30/2019 11:59 AM EDT ----- Normal/stable labs, except sodium is slightly elevated.  Be sure to stay well hydrated

## 2019-05-18 ENCOUNTER — Other Ambulatory Visit: Payer: Self-pay | Admitting: Family Medicine

## 2019-05-18 ENCOUNTER — Telehealth: Payer: Self-pay | Admitting: Family Medicine

## 2019-05-18 NOTE — Telephone Encounter (Signed)
Medication Refill - Estradiol 0.1 mc/gmMedication:    Has the patient contacted their pharmacy? No. (Agent: If no, request that the patient contact the pharmacy for the refill.) (Agent: If yes, when and what did the pharmacy advise?)  Preferred Pharmacy (with phone number or street name): [facility]  Agent: Please be advised that RX refills may take up to 3 business days. We ask that you follow-up with your pharmacy.

## 2019-05-18 NOTE — Telephone Encounter (Signed)
Forwarding medication refill request to PCP for review. 

## 2019-05-18 NOTE — Telephone Encounter (Signed)
Medication Refill - Medication: Estradoil 0.1 mc/gm  Has the patient contacted their pharmacy? No. (Agent: If no, request that the patient contact the pharmacy for the refill.) (Agent: If yes, when and what did the pharmacy advise?)  Preferred Pharmacy (with phone number or street name): [facility]  Agent: Please be advised that RX refills may take up to 3 business days. We ask that you follow-up with your pharmacy.

## 2019-05-21 MED ORDER — ESTRADIOL 0.1 MG/GM VA CREA: 1.0000 | TOPICAL_CREAM | VAGINAL | 5 refills | Status: DC

## 2019-06-23 IMAGING — CR DG KNEE COMPLETE 4+V*R*
1 series · 4 of 4 positions shown · non-contrast
Comparison: None.

CLINICAL DATA: Right knee pain, no acute injury

EXAM:
RIGHT KNEE - COMPLETE 4+ VIEW

[Series 1: dg knee complete 4 views right · 0.14mm/px · 4 of 4 slices shown]
[im 1/4]
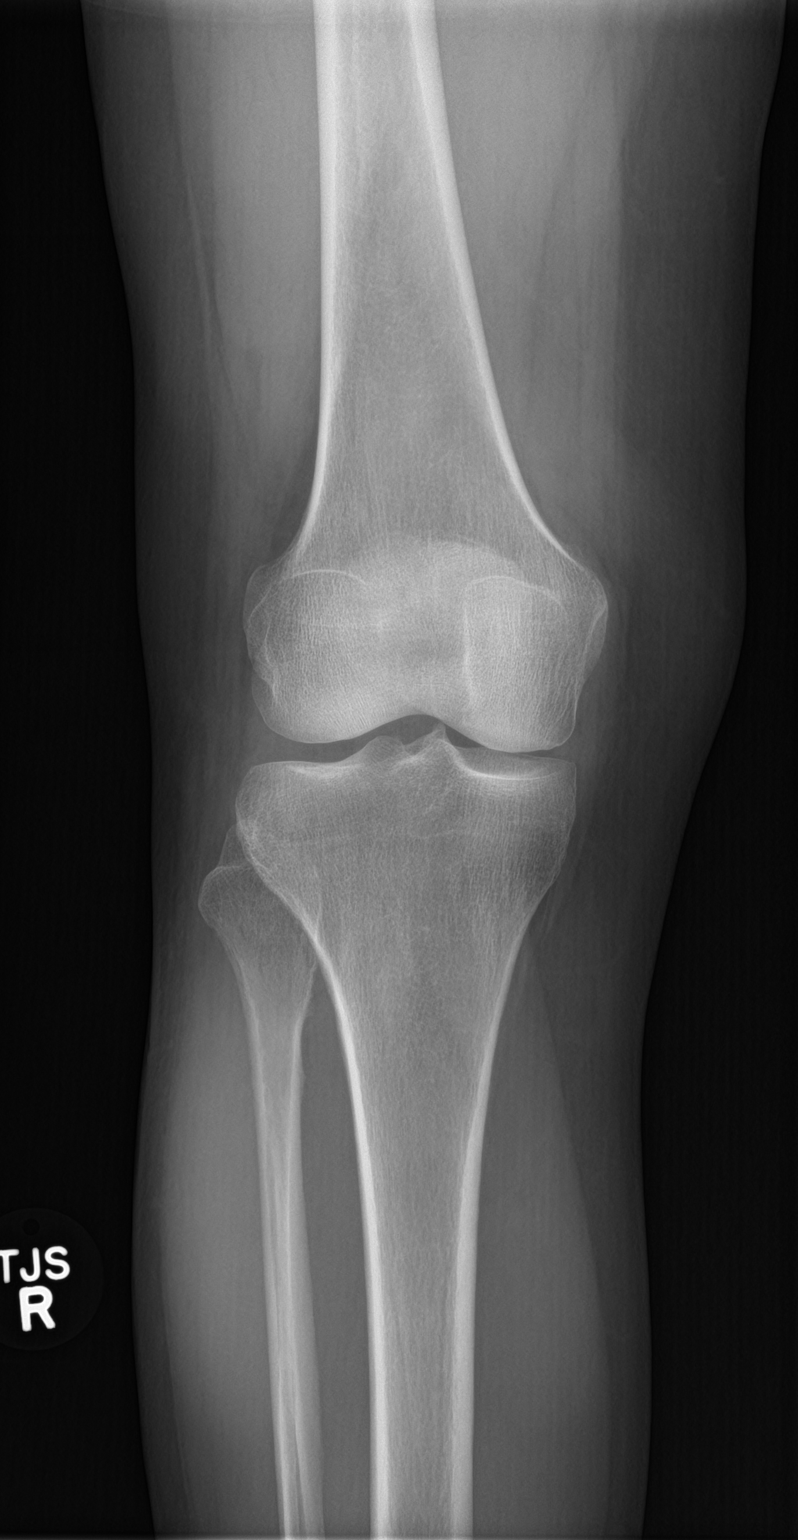
[im 2/4]
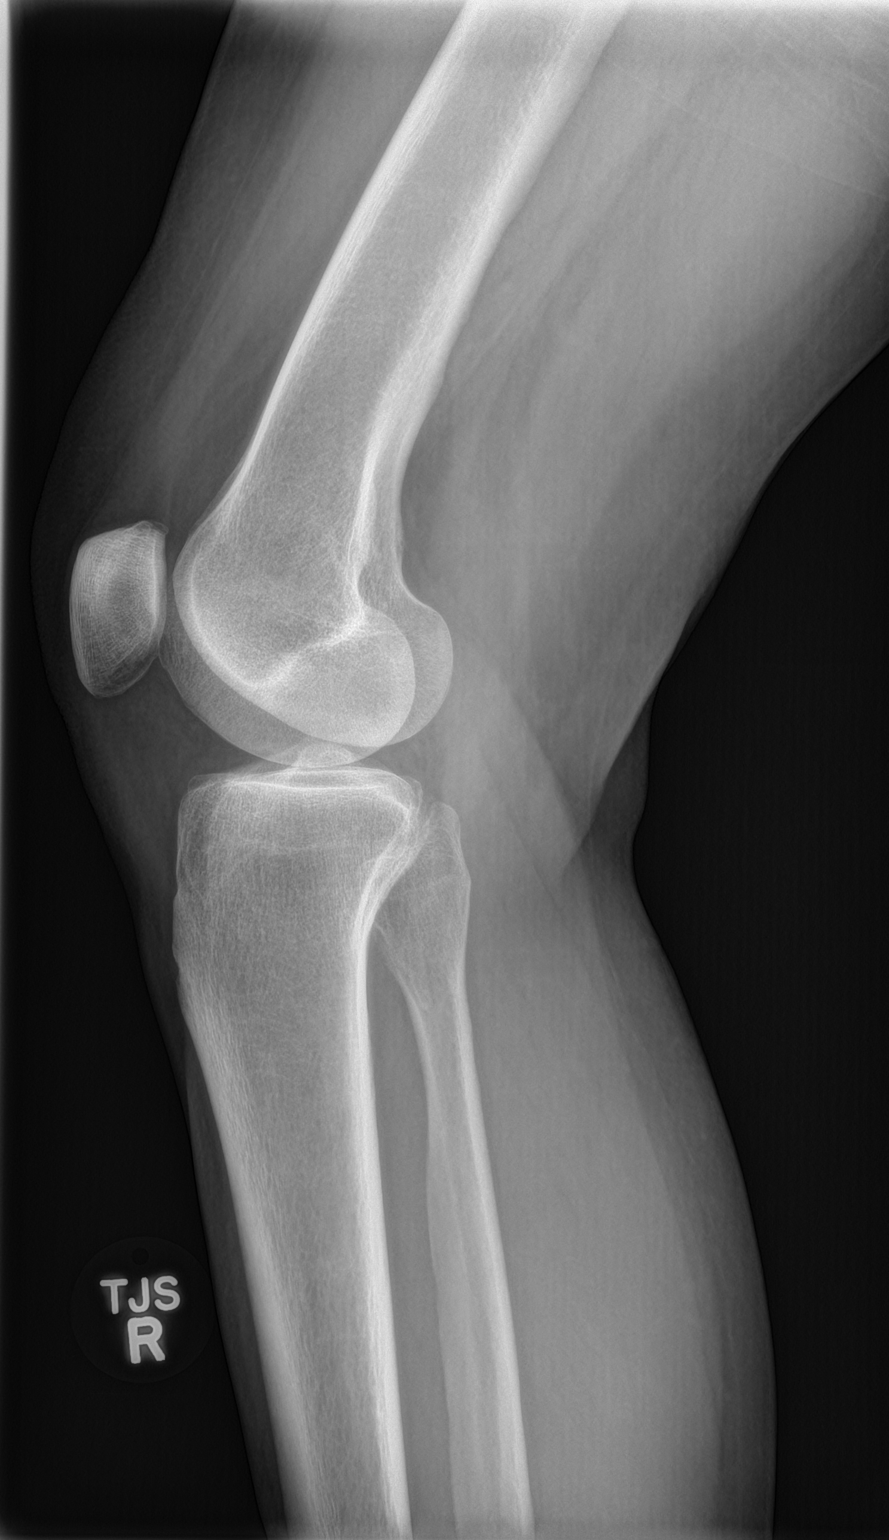
[im 3/4]
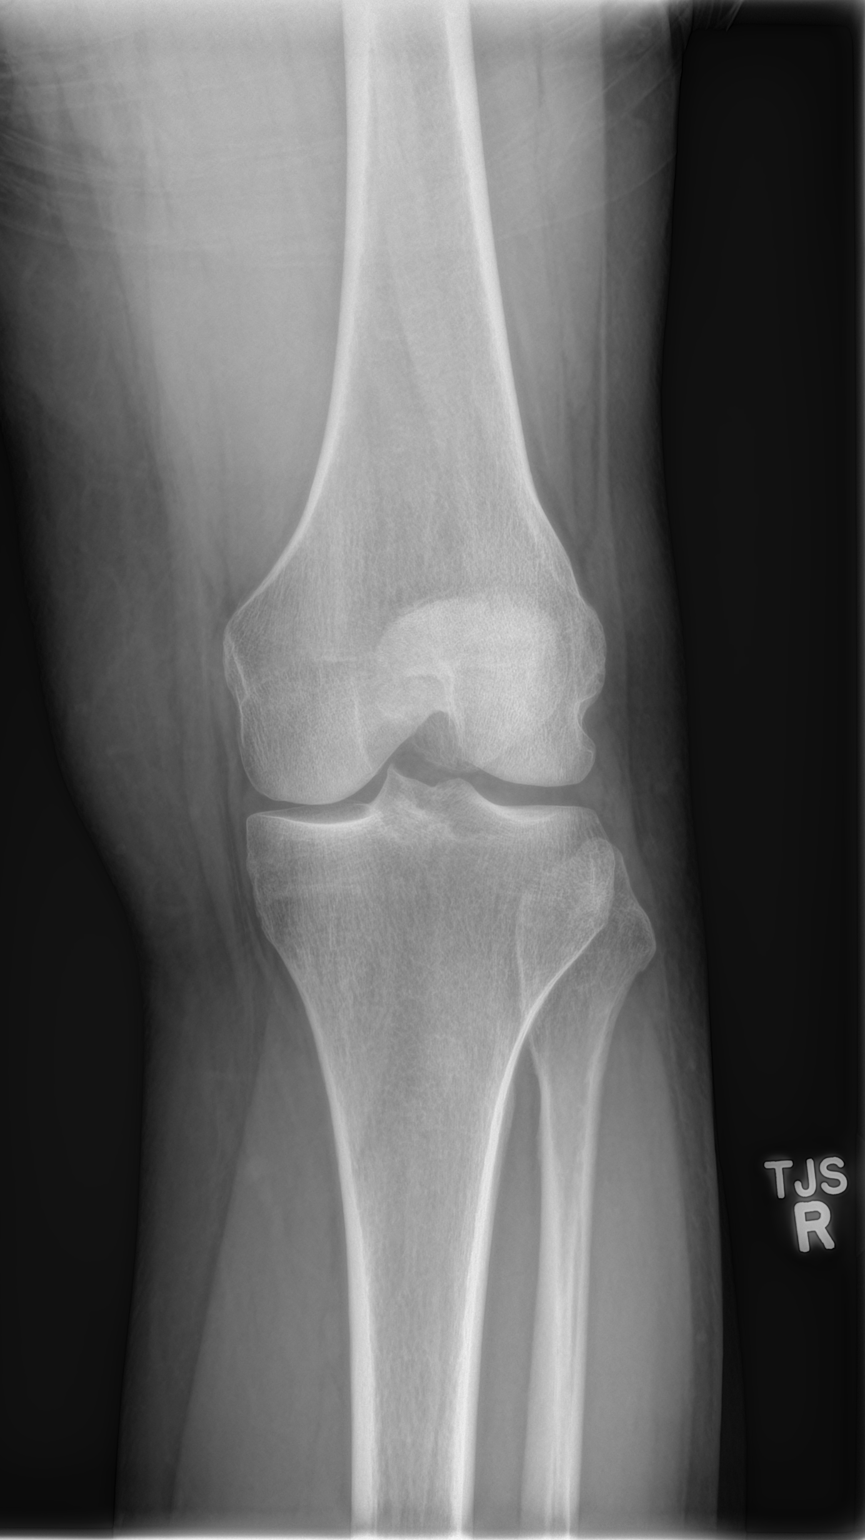
[im 4/4]
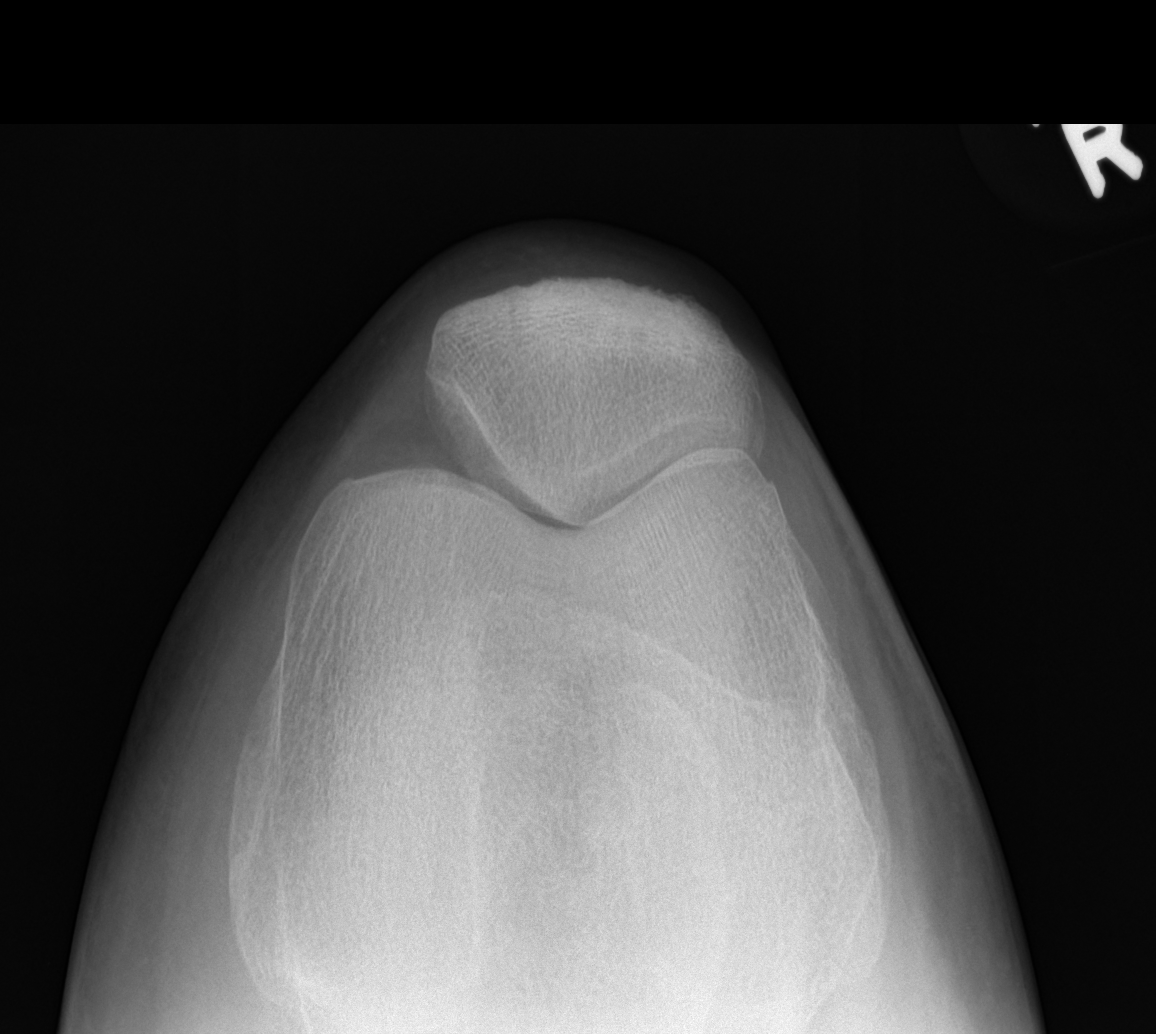

[4 of 4 positions shown; findings below may reference images not displayed]

FINDINGS: The right knee joint spaces appear to be relatively well preserved
for age. There may be minimal decrease in medial compartment joint
space. No fracture seen and no joint effusion is noted. The patella
is normally position.
IMPRESSION: No significant abnormality other than perhaps minimally decreased
medial compartment joint space.

## 2019-10-25 ENCOUNTER — Other Ambulatory Visit: Payer: Self-pay | Admitting: General Surgery

## 2019-10-29 LAB — SURGICAL PATHOLOGY

## 2019-11-02 IMAGING — MR MR BRAIN/IAC WO/W
10 of 14 series · 27 of 48 positions shown · IV contrast (multihance)
Comparison: None.

CLINICAL DATA: Left-sided tinnitus

EXAM:
MRI HEAD WITHOUT AND WITH CONTRAST
TECHNIQUE: Multiplanar, multiecho pulse sequences of the brain and surrounding
structures were obtained without and with intravenous contrast.
CONTRAST:  6.5 mL MultiHance IV

[Series 5: T1 · sagittal · 5.0mm · 0.62mm/px · 1 of 23 slices shown (1 of 3)]
[im 1/23]
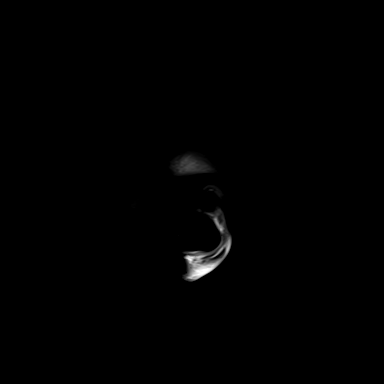

[Series 6: ax dwi_tracew · axial · 3.0mm · 0.73mm/px · z∈[-50,+111]mm · 4 of 55 slices shown (1 of 2)]
[im 1/55]
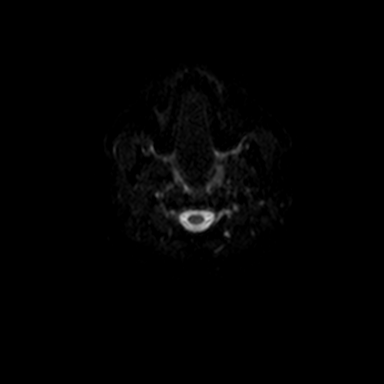
[im 19/55]
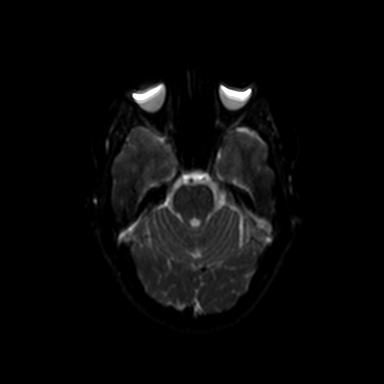
[im 37/55]
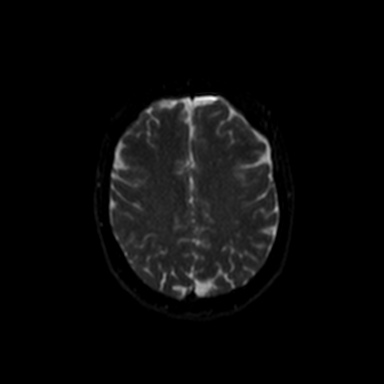
[im 55/55]
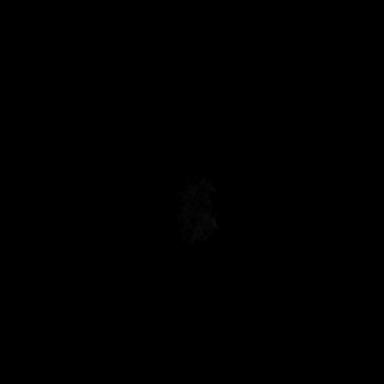

[Series 6: ax dwi_tracew · axial · 3.0mm · 0.73mm/px · z∈[-50,+57]mm · 3 of 55 slices shown (2 of 2)]
[im 1/55]
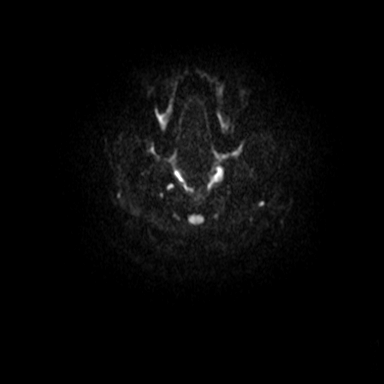
[im 19/55]
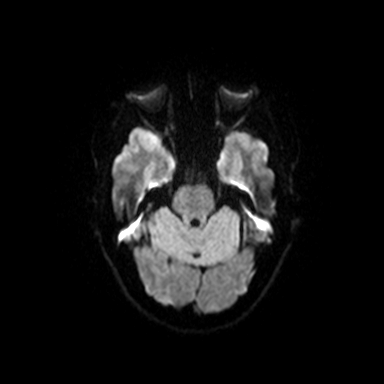
[im 37/55]
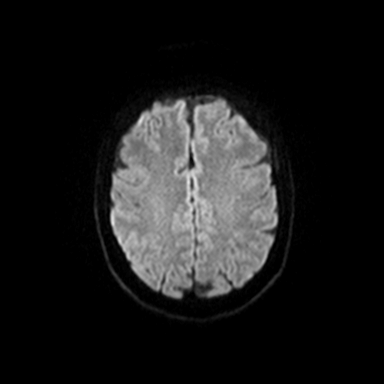

[Series 8: T2 · axial · 5.0mm · 0.53mm/px · z∈[-56,+100]mm · 2 of 27 slices shown]
[im 1/27]
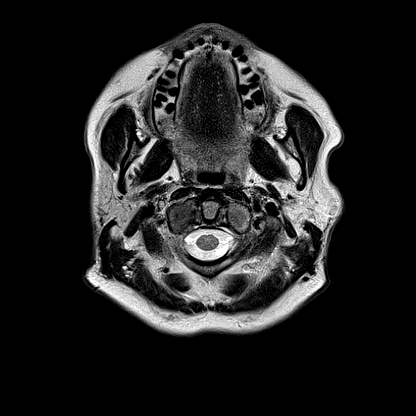
[im 27/27]
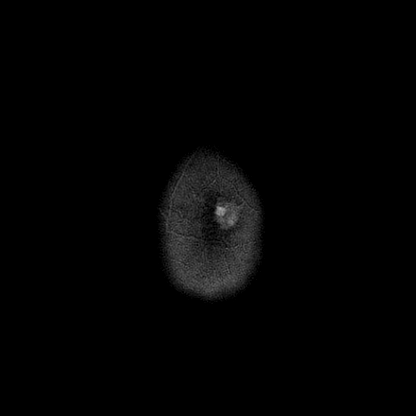

[Series 11: FLAIR · axial · 3.0mm · 0.53mm/px · z∈[-59,+103]mm · 4 of 55 slices shown]
[im 1/55]
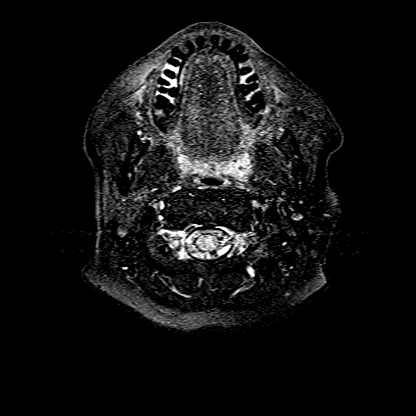
[im 19/55]
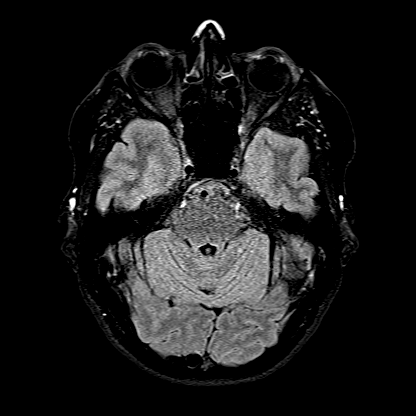
[im 37/55]
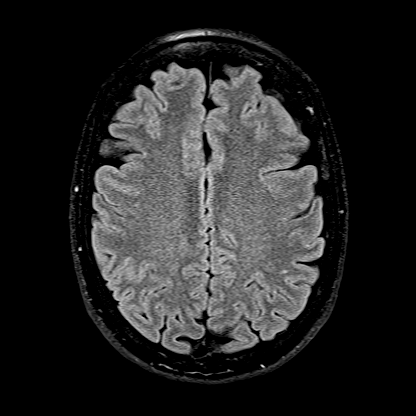
[im 55/55]
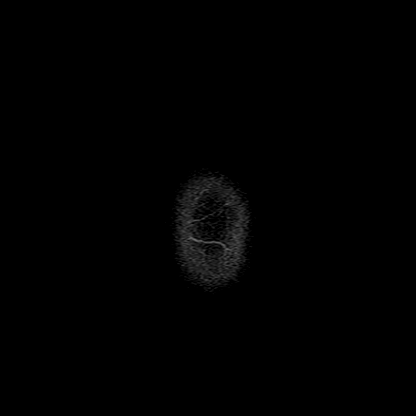

[Series 12: T1 · coronal · non-contrast · 3.0mm · 0.21mm/px · 1 of 13 slices shown (2 of 3)]
[im 1/13]
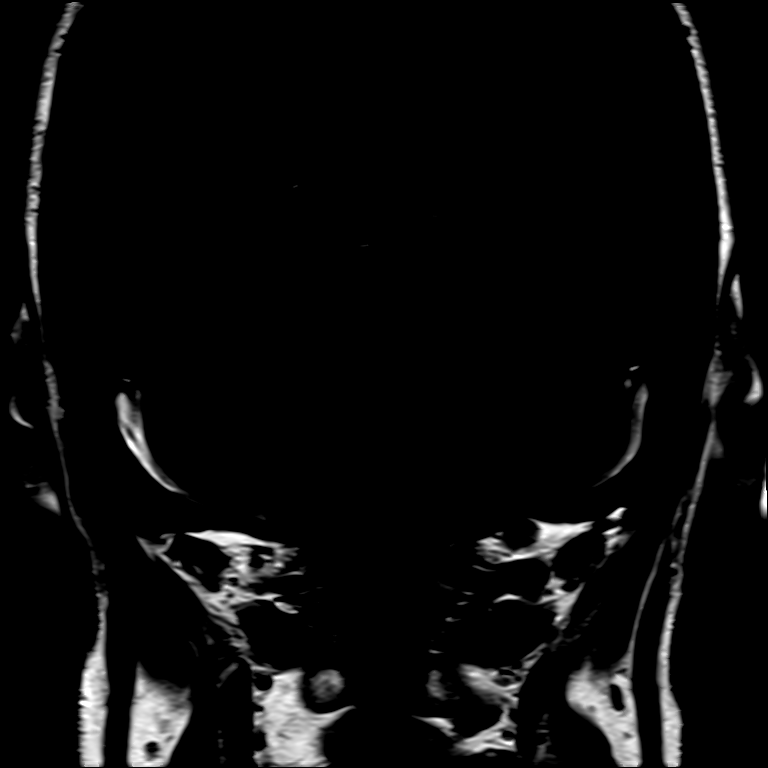

[Series 14: T1 · axial · non-contrast · 3.0mm · 0.21mm/px · 1 of 15 slices shown (3 of 3)]
[im 1/15]
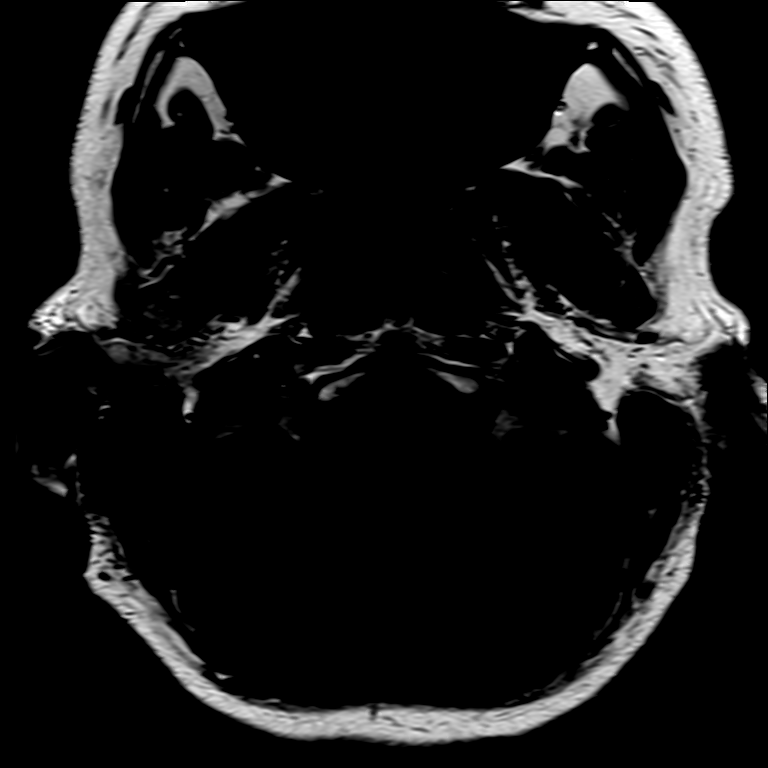

[Series 15: T1 post-contrast · axial · 3.0mm · 0.21mm/px · 1 of 15 slices shown (1 of 3)]
[im 1/15]
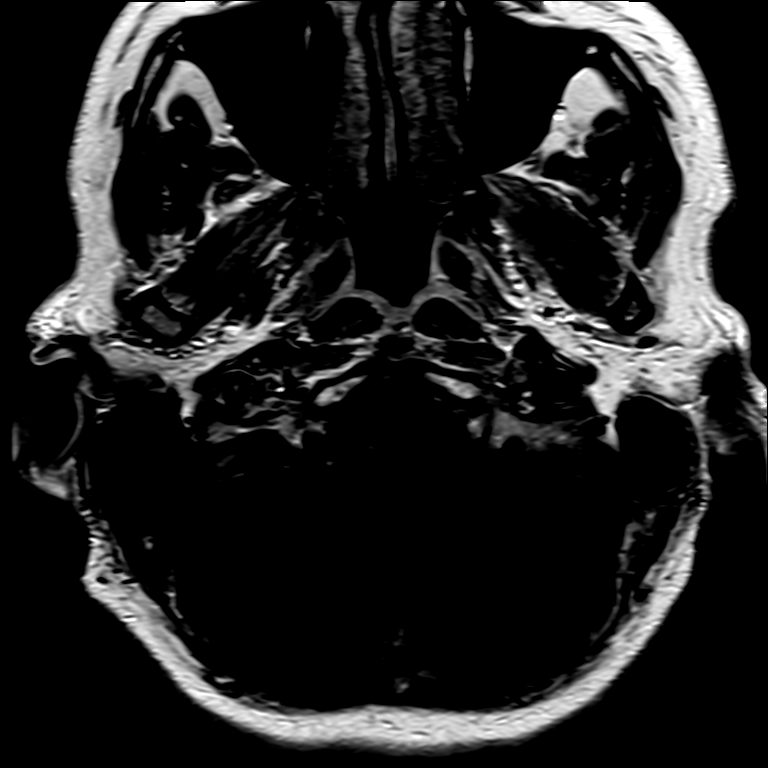

[Series 16: T1 post-contrast · coronal · 3.0mm · 0.21mm/px · 1 of 13 slices shown (2 of 3)]
[im 1/13]
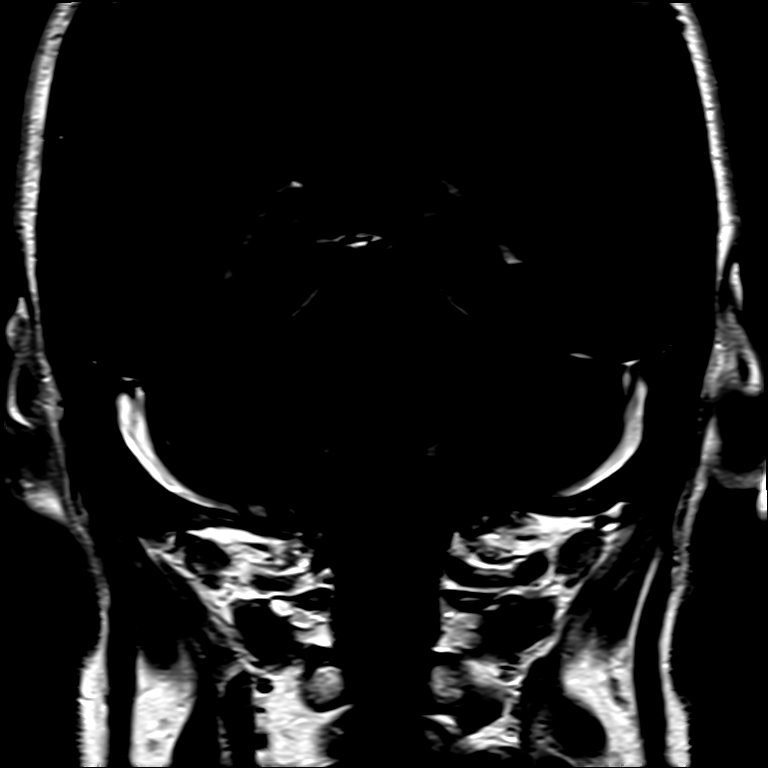

[Series 17: T1 post-contrast · axial · 1.0mm · 0.98mm/px · z∈[-65,+109]mm · 9 of 176 slices shown (3 of 3)]
[im 1/176]
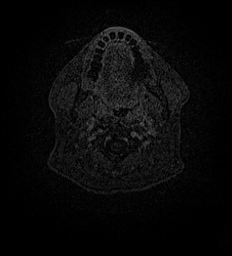
[im 32/176]
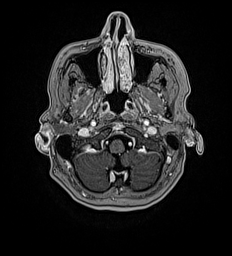
[im 48/176]
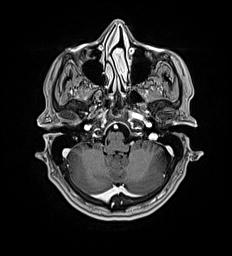
[im 80/176]
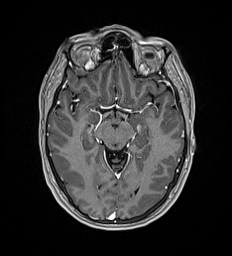
[im 96/176]
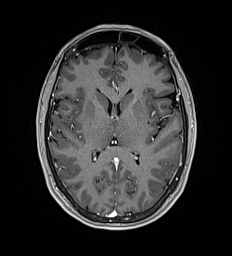
[im 128/176]
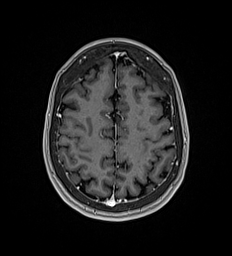
[im 144/176]
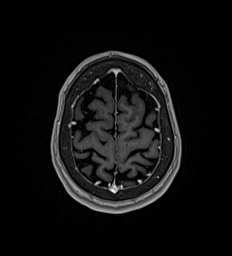
[im 160/176]
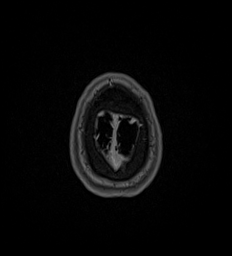
[im 176/176]
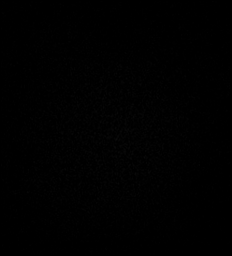

[27 of 48 positions shown; findings below may reference images not displayed]

FINDINGS: Brain: IAC protocol was performed including thin section imaging
through the posterior fossa before and after intravenous contrast.
Seventh and 8th cranial nerves are normal. Negative for vestibular
schwannoma. Basilar cisterns normal. Brainstem and cerebellum
normal. Mastoid sinus is clear bilaterally. No enhancing mass in the
posterior fossa or temporal bone.

Ventricle size normal. Negative for infarct hemorrhage or mass.
Normal enhancement.

Vascular: Normal arterial flow voids

Skull and upper cervical spine: Negative

Sinuses/Orbits: Negative

Other: None
IMPRESSION: Negative MRI of the head with contrast with special attention to the
posterior fossa. No cause for tinnitus identified.

## 2020-02-01 ENCOUNTER — Ambulatory Visit (INDEPENDENT_AMBULATORY_CARE_PROVIDER_SITE_OTHER): Payer: Commercial Managed Care - PPO | Admitting: Family Medicine

## 2020-02-01 ENCOUNTER — Encounter: Payer: Self-pay | Admitting: Family Medicine

## 2020-02-01 ENCOUNTER — Other Ambulatory Visit: Payer: Self-pay

## 2020-02-01 VITALS — BP 106/62 | HR 82 | Temp 98.3°F | Ht 65.0 in | Wt 152.0 lb

## 2020-02-01 DIAGNOSIS — Z0000 Encounter for general adult medical examination without abnormal findings: Secondary | ICD-10-CM | POA: Diagnosis not present

## 2020-02-01 DIAGNOSIS — E559 Vitamin D deficiency, unspecified: Secondary | ICD-10-CM

## 2020-02-01 DIAGNOSIS — E7800 Pure hypercholesterolemia, unspecified: Secondary | ICD-10-CM

## 2020-02-01 NOTE — Patient Instructions (Signed)

## 2020-02-01 NOTE — Progress Notes (Signed)
I,Laura E Walsh,acting as a scribe for Lavon Paganini, MD.,have documented all relevant documentation on the behalf of Lavon Paganini, MD,as directed by  Lavon Paganini, MD while in the presence of Lavon Paganini, MD.   Complete physical exam   Patient: Donna Hoffman   DOB: 1961-04-28   59 y.o. Female  MRN: [id number] Visit Date: 02/01/2020  Today's healthcare provider: Lavon Paganini, MD   Chief Complaint  Patient presents with   Annual Exam   Subjective    Donna Hoffman is a 59 y.o. female who presents today for a complete physical exam.  She reports consuming a general diet. Exercises regularly She generally feels well. She reports sleeping fairly well. She does not have additional problems to discuss today.  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  Plan adjuvant chemotherapy.   GERD (gastroesophageal reflux disease)    Heart murmur    Malignant neoplasm of breast (female), unspecified site 05/11/2010   Invasive ductal carcinoma, extensive DCIS. BRCA negative/ right breast   Primary cancer of upper outer quadrant of right female breast ([place]) 06/05/2015   Past Surgical History:  Procedure Laterality Date   ABDOMINAL HYSTERECTOMY  05/04/13   total hysterectomy   BREAST SURGERY Left 05/27/2011   Left simple mastectomy for benign disease.   BREAST SURGERY Right 05/28/2010   Right simple mastectomy/sentinel node biopsy/breast ca   COLONOSCOPY  2012   COLONOSCOPY  03/09/2011   Normal exam   DILATION AND CURETTAGE OF UTERUS     MASTECTOMY Bilateral 2011,2012   UPPER GI ENDOSCOPY     Social History   Socioeconomic History   Marital status: Married    Spouse name: Ronalee Belts   Number of children: 2   Years of education: some college   Highest education level: Some college, no degree  Occupational History     Employer: [employer]  Tobacco Use   Smoking status: Former Smoker    Years: 0.00   Smokeless tobacco: Never Used   Tobacco comment: was previously social smoker  Scientific laboratory technician Use: Never used  Substance and Sexual Activity   Alcohol use: No   Drug use: No   Sexual activity: Yes  Other Topics Concern   Not on file  Social History Narrative   Not on file   Social Determinants of Health   Financial Resource Strain:    Difficulty of Paying Living Expenses: Not on file  Food Insecurity:    Worried About Charity fundraiser in the Last Year: Not on file   [employer] of Food in the Last Year: Not on file  Transportation Needs:    Lack of Transportation (Medical): Not on file   Lack of Transportation (Non-Medical): Not on file  Physical Activity:    Days of Exercise per Week: Not on file   Minutes of Exercise per Session: Not on file  Stress:    Feeling of Stress : Not on file  Social Connections:    Frequency of Communication with Friends and Family: Not on file   Frequency of Social Gatherings with Friends and Family: Not on file   Attends Religious Services: Not on file   Active Member of Clubs or Organizations: Not on file   Attends Archivist Meetings: Not on file   Marital Status:  on file  Intimate Partner Violence:    Fear of Current or Ex-Partner: Not on file   Emotionally Abused: Not on file   Physically Abused: Not on file   Sexually Abused: Not on file   Family Status  Relation Name Status   Mother  Alive   Father  Alive   Sister  Alive   Brother  Alive   Sister  Alive   Sister  Alive   PGF  (Not Specified)   Family History  Problem Relation Age of Onset   Breast cancer Mother        x2   Arthritis Mother    Cancer Mother        breast   Colon polyps Father    Diabetes Father    Hypertension Father    Cancer Father        skin   Arthritis Sister    Healthy Brother    Healthy  Sister    Healthy Sister    Colon cancer Paternal Grandfather    Allergies  Allergen Reactions   Iodinated Diagnostic Agents Swelling    Eye swelling   Epinephrine    Neomycin Hives   Other Other (See Comments)    polymycin - blisters   Penicillins Itching    Patient Care Team: Virginia Crews, MD as PCP - General (Family Medicine) Robert Bellow, MD (General Surgery)   Medications: Outpatient Medications Prior to Visit  Medication Sig   cholecalciferol (VITAMIN D) 1000 UNITS tablet Take 1,000 Units by mouth daily.   estradiol (ESTRACE) 0.1 MG/GM vaginal cream Place 1 Applicatorful vaginally 3 (three) times a week. At bedtime   CALCIUM CARBONATE PO Take 300 mg by mouth.   meloxicam (MOBIC) 15 MG tablet Take 1 tablet by mouth as needed.   No facility-administered medications prior to visit.    Review of Systems  Constitutional: Negative.   HENT: Negative.   Eyes: Negative.   Respiratory: Negative.   Cardiovascular: Negative.   Gastrointestinal: Negative.   Endocrine: Negative.   Genitourinary: Negative.   Musculoskeletal: Negative.   Skin: Negative.   Allergic/Immunologic: Negative.   Neurological: Negative.   Hematological: Negative.   Psychiatric/Behavioral: Negative.     Last CBC Lab Results  Component Value Date   WBC 5.1 03/29/2019   HGB 13.0 03/29/2019   HCT 41.3 03/29/2019   MCV 90 03/29/2019   MCH 28.4 03/29/2019   RDW 13.0 03/29/2019   PLT 303 35/59/7416   Last metabolic panel Lab Results  Component Value Date   GLUCOSE 71 03/29/2019   NA 145 (H) 03/29/2019   K 4.4 03/29/2019   CL 106 03/29/2019   CO2 26 03/29/2019   BUN 16 03/29/2019   CREATININE 0.73 03/29/2019   GFRNONAA 91 03/29/2019   GFRAA 105 03/29/2019   CALCIUM 9.5 03/29/2019   PROT 6.8 03/29/2019   ALBUMIN 4.2 03/29/2019   LABGLOB 2.6 03/29/2019   AGRATIO 1.6 03/29/2019   BILITOT 0.3 03/29/2019   ALKPHOS 121 (H) 03/29/2019   AST 14 03/29/2019   ALT 10  03/29/2019   Last lipids Lab Results  Component Value Date   CHOL 215 (H) 01/25/2019   HDL 59 01/25/2019   LDLCALC 132 (H) 01/25/2019   TRIG 122 01/25/2019   CHOLHDL 3.6 01/25/2019    Objective    BP 106/62 (BP Location: Right Arm, Patient Position: Sitting, Cuff Size: Large)    Pulse 82    Temp 98.3 F (36.8 C) (Oral)  Ht 5' 5" (1.651 m)    Wt 152 lb (68.9 kg)    LMP 12/05/2012    SpO2 95%    BMI 25.29 kg/m    Physical Exam Constitutional:      Appearance: Normal appearance.  HENT:     Head: Normocephalic and atraumatic.     Right Ear: Tympanic membrane, ear canal and external ear normal.     Left Ear: Tympanic membrane, ear canal and external ear normal.  Eyes:     Extraocular Movements: Extraocular movements intact.     Conjunctiva/sclera: Conjunctivae normal.     Pupils: Pupils are equal, round, and reactive to light.  Cardiovascular:     Rate and Rhythm: Normal rate and regular rhythm.     Pulses: Normal pulses.     Heart sounds: Normal heart sounds.  Pulmonary:     Effort: Pulmonary effort is normal.     Breath sounds: Normal breath sounds.  Abdominal:     Palpations: Abdomen is soft.     Tenderness: There is no abdominal tenderness.  Musculoskeletal:     Cervical back: Neck supple. No tenderness.     Right lower leg: No edema.     Left lower leg: No edema.  Lymphadenopathy:     Cervical: No cervical adenopathy.  Skin:    General: Skin is warm and dry.  Neurological:     Mental Status: She is alert and oriented to person, place, and time. Mental status is at baseline.  Psychiatric:        Mood and Affect: Mood normal.        Behavior: Behavior normal.        Thought Content: Thought content normal.        Judgment: Judgment normal.       Last depression screening scores PHQ 2/9 Scores 02/01/2020 01/25/2019 11/28/2017  PHQ - 2 Score 0 0 0  PHQ- 9 Score 1 2 -   Last fall risk screening Fall Risk  02/01/2020  Falls in the past year? 0  Number falls  in past yr: 0  Injury with Fall? 0  Follow up Falls evaluation completed   Last Audit-C alcohol use screening Alcohol Use Disorder Test (AUDIT) 02/01/2020  1. How often do you have a drink containing alcohol? 1  2. How many drinks containing alcohol do you have on a typical day when you are drinking? 0  3. How often do you have six or more drinks on one occasion? 0  AUDIT-C Score 1  Alcohol Brief Interventions/Follow-up AUDIT Score <7 follow-up not indicated   A score of 3 or more in women, and 4 or more in men indicates increased risk for alcohol abuse, EXCEPT if all of the points are from question 1   No results found for any visits on 02/01/20.  Assessment & Plan    Routine Health Maintenance and Physical Exam  Exercise Activities and Dietary recommendations Goals     Exercise 150 minutes per week (moderate activity)       Immunization History  Administered Date(s) Administered   PFIZER SARS-COV-2 Vaccination 06/18/2019, 07/14/2019   Td 03/11/1997   Tdap 02/21/2007, 01/21/2017    Health Maintenance  Topic Date Due   INFLUENZA VACCINE  01/13/2020   COLONOSCOPY  03/08/2021   TETANUS/TDAP  01/22/2027   COVID-19 Vaccine  Completed   Hepatitis C Screening  Completed   HIV Screening  Completed    Discussed health benefits of physical activity, and encouraged her to  engage in regular exercise appropriate for her age and condition.   Problem List Items Addressed This Visit      Other   Hypercholesterolemia   Relevant Orders   Lipid panel   Comprehensive metabolic panel   Avitaminosis D   Relevant Orders   VITAMIN D 25 Hydroxy (Vit-D Deficiency, Fractures)    Other Visit Diagnoses    Annual physical exam    -  Primary   Relevant Orders   TSH   CBC with Differential/Platelet      Return in about 1 year (around 01/31/2021) for CPE.     I, Lavon Paganini, MD, have reviewed all documentation for this visit. The documentation on 02/01/20 for the exam,  diagnosis, procedures, and orders are all accurate and complete.   Pedro Oldenburg, Dionne Bucy, MD, MPH [facility]

## 2020-02-02 LAB — LIPID PANEL
Chol/HDL Ratio: 4 ratio (ref 0.0–4.4)
Cholesterol, Total: 242 mg/dL — ABNORMAL HIGH (ref 100–199)
HDL: 60 mg/dL (ref >39)
LDL Chol Calc (NIH): 164 mg/dL — ABNORMAL HIGH (ref 0–99)
Triglycerides: 105 mg/dL (ref 0–149)
VLDL Cholesterol Cal: 18 mg/dL (ref 5–40)

## 2020-02-02 LAB — COMPREHENSIVE METABOLIC PANEL
ALT: 9 IU/L (ref 0–32)
AST: 13 IU/L (ref 0–40)
Albumin/Globulin Ratio: 2 (ref 1.2–2.2)
Albumin: 4.5 g/dL (ref 3.8–4.9)
Alkaline Phosphatase: 121 IU/L (ref 48–121)
BUN/Creatinine Ratio: 20 (ref 9–23)
BUN: 14 mg/dL (ref 6–24)
Bilirubin Total: 0.5 mg/dL (ref 0.0–1.2)
CO2: 23 mmol/L (ref 20–29)
Calcium: 9.6 mg/dL (ref 8.7–10.2)
Chloride: 103 mmol/L (ref 96–106)
Creatinine, Ser: 0.71 mg/dL (ref 0.57–1.00)
GFR calc Af Amer: 108 mL/min/{1.73_m2} (ref >59)
GFR calc non Af Amer: 94 mL/min/{1.73_m2} (ref >59)
Globulin, Total: 2.3 g/dL (ref 1.5–4.5)
Glucose: 81 mg/dL (ref 65–99)
Potassium: 4.4 mmol/L (ref 3.5–5.2)
Sodium: 140 mmol/L (ref 134–144)
Total Protein: 6.8 g/dL (ref 6.0–8.5)

## 2020-02-02 LAB — CBC WITH DIFFERENTIAL/PLATELET
Basophils Absolute: 0 10*3/uL (ref 0.0–0.2)
Basos: 1 %
EOS (ABSOLUTE): 0.1 10*3/uL (ref 0.0–0.4)
Eos: 2 %
Hematocrit: 38.4 % (ref 34.0–46.6)
Hemoglobin: 13.1 g/dL (ref 11.1–15.9)
Immature Grans (Abs): 0 10*3/uL (ref 0.0–0.1)
Immature Granulocytes: 0 %
Lymphocytes Absolute: 1.2 10*3/uL (ref 0.7–3.1)
Lymphs: 26 %
MCH: 30.3 pg (ref 26.6–33.0)
MCHC: 34.1 g/dL (ref 31.5–35.7)
MCV: 89 fL (ref 79–97)
Monocytes Absolute: 0.4 10*3/uL (ref 0.1–0.9)
Monocytes: 7 %
Neutrophils Absolute: 3.1 10*3/uL (ref 1.4–7.0)
Neutrophils: 64 %
Platelets: 258 10*3/uL (ref 150–450)
RBC: 4.33 x10E6/uL (ref 3.77–5.28)
RDW: 13 % (ref 11.7–15.4)
WBC: 4.8 10*3/uL (ref 3.4–10.8)

## 2020-02-02 LAB — VITAMIN D 25 HYDROXY (VIT D DEFICIENCY, FRACTURES): Vit D, 25-Hydroxy: 34.8 ng/mL (ref 30.0–100.0)

## 2020-02-02 LAB — TSH: TSH: 0.846 u[IU]/mL (ref 0.450–4.500)

## 2020-02-05 ENCOUNTER — Telehealth: Payer: Self-pay

## 2020-02-05 NOTE — Telephone Encounter (Signed)
-----   Message from Virginia Crews, MD sent at 02/05/2020  8:37 AM EDT ----- Normal labs, except cholesterol is high. The 10-year ASCVD (heart disease and stroke) risk score Mikey Bussing DC Jr., et al., 2013) is: 2.3%, which is low.  No need for medications at this time, but I do recommend diet low in saturated fat and regular exercise - 30 min at least 5 times per week.

## 2020-02-05 NOTE — Telephone Encounter (Signed)
Written by Virginia Crews, MD on 02/05/2020 8:37 AM EDT Seen by patient Donna Hoffman on 02/05/2020 9:01 AM

## 2020-07-12 ENCOUNTER — Other Ambulatory Visit: Payer: Self-pay | Admitting: Family Medicine

## 2020-09-09 ENCOUNTER — Ambulatory Visit: Payer: Self-pay | Admitting: *Deleted

## 2020-09-09 NOTE — Telephone Encounter (Signed)
Patient c/o heart racing and beating hard and comes and goes. Started 2-3 weeks ago . Lasting for several minutes at a time. Patient has recently stopped caffeine consumption and continues to notice symptoms. Hx mitral valve prolapse with regurgitation. Denies chest pain, difficulty breathing, sweating, dizziness or lightheadedness. No available appt until 09/16/20. Please advise. Care advise given. Patient verbalized understanding of care advise and to call back or go to ED if symptoms worsen.   Reason for Disposition . [1] Skipped or extra beat(s) AND [2] occurs 4 or more times per minute  Answer Assessment - Initial Assessment Questions 1. DESCRIPTION: "Please describe your heart rate or heartbeat that you are having" (e.g., fast/slow, regular/irregular, skipped or extra beats, "palpitations")     Skipped beats 2. ONSET: "When did it start?" (Minutes, hours or days)      3- 4 weeks ago 3. DURATION: "How long does it last" (e.g., seconds, minutes, hours)     Several minutes 4. PATTERN "Does it come and go, or has it been constant since it started?"  "Does it get worse with exertion?"   "Are you feeling it now?"     Comes and goes  Yes feeling it now  5. TAP: "Using your hand, can you tap out what you are feeling on a chair or table in front of you, so that I can hear?" (Note: not all patients can do this)       na 6. HEART RATE: "Can you tell me your heart rate?" "How many beats in 15 seconds?"  (Note: not all patients can do this)       No heart feels like it is "beating hard" 7. RECURRENT SYMPTOM: "Have you ever had this before?" If Yes, ask: "When was the last time?" and "What happened that time?"      Yes. Has mitral valve prolapse with regurgitation 8. CAUSE: "What do you think is causing the palpitations?"      unknown 9. CARDIAC HISTORY: "Do you have any history of heart disease?" (e.g., heart attack, angina, bypass surgery, angioplasty, arrhythmia)      Mitral valve prolapse with  regurgitation 10. OTHER SYMPTOMS: "Do you have any other symptoms?" (e.g., dizziness, chest pain, sweating, difficulty breathing)      Heart racing and beating hard 11. PREGNANCY: "Is there any chance you are pregnant?" "When was your last menstrual period?"       na  Protocols used: [place]

## 2020-09-09 NOTE — Telephone Encounter (Signed)
Can see if any other providers have availability before 4/5. She could also f/u with her cardiologist.

## 2020-09-10 NOTE — Telephone Encounter (Signed)
Patient is not established with cardiologist and is not wanting to see PA. Patient reports she will wait to see PCP on 09/16/20. Patient advised to call or go to ER if symptoms get worse.

## 2020-09-16 ENCOUNTER — Ambulatory Visit: Payer: Commercial Managed Care - PPO | Admitting: Family Medicine

## 2020-09-16 ENCOUNTER — Other Ambulatory Visit: Payer: Self-pay

## 2020-09-16 ENCOUNTER — Encounter: Payer: Self-pay | Admitting: Family Medicine

## 2020-09-16 VITALS — BP 104/72 | HR 67 | Temp 98.3°F | Resp 16 | Ht 65.0 in | Wt 155.8 lb

## 2020-09-16 DIAGNOSIS — R002 Palpitations: Secondary | ICD-10-CM | POA: Diagnosis not present

## 2020-09-16 NOTE — Assessment & Plan Note (Signed)
Recurrent issue When she had this 2 years ago, it got better after stopping caffeine intake, so was thought to be PVCs Now not improving with stopping caffeine intake Recheck TSH, CMP, CBC EKG reassuring We will refer to cardiology for cardiac monitor We are not set up to do Zio patch as yet, but I do think she would benefit from one

## 2020-09-16 NOTE — Patient Instructions (Signed)

## 2020-09-16 NOTE — Progress Notes (Signed)
Established patient visit   Patient: Donna Hoffman   DOB: [birth year]   60 y.o. Female  MRN: [id number] Visit Date: 09/16/2020  Today's healthcare provider: Lavon Paganini, MD   Chief Complaint  Patient presents with  . Palpitations   Subjective    Palpitations  This is a new problem. The current episode started 1 to 4 weeks ago. The problem occurs intermittently. The problem has been unchanged. Nothing aggravates the symptoms. Associated symptoms include an irregular heartbeat. Pertinent negatives include no anxiety, chest fullness, chest pain, coughing, diaphoresis, dizziness, fever, malaise/fatigue, nausea, near-syncope, numbness, shortness of breath, syncope, vomiting or weakness. She has tried breathing exercises for the symptoms. The treatment provided no relief.    x3 wks Has had 3 episodes previously - many years ago Trying to cut back on caffeine Worse at night  No chest pain or shortness of breath  Patient Active Problem List   Diagnosis Date Noted  . Palpitation 03/29/2019  . Bursitis of hip 01/24/2019  . Lumbar radiculopathy 04/17/2018  . Acute pain of right knee 11/28/2017  . Vaginal atrophy 11/28/2017  . Low back pain 01/21/2017  . Elevated liver enzymes 09/24/2015  . Hypercholesterolemia 06/05/2015  . Adenitis 06/05/2015  . Avitaminosis D 06/05/2015  . History of breast cancer 07/04/2013  . Umbilical hernia 84/16/6063  . Allergic rhinitis 07/29/2008  . Acid reflux 07/04/2008  . Disorder of mitral valve 07/04/2008   Past Medical History:  Diagnosis Date  . 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  Plan adjuvant chemotherapy.  Marland Kitchen GERD (gastroesophageal reflux disease)   . Heart murmur   . Malignant neoplasm of breast (female), unspecified site 05/11/2010   Invasive ductal carcinoma, extensive DCIS. BRCA negative/ right breast  . Primary cancer of upper outer quadrant  of right female breast ([place]) 06/05/2015   Past Surgical History:  Procedure Laterality Date  . ABDOMINAL HYSTERECTOMY  05/04/13   total hysterectomy  . BREAST SURGERY Left 05/27/2011   Left simple mastectomy for benign disease.  Marland Kitchen BREAST SURGERY Right 05/28/2010   Right simple mastectomy/sentinel node biopsy/breast ca  . COLONOSCOPY  2012  . COLONOSCOPY  03/09/2011   Normal exam  . DILATION AND CURETTAGE OF UTERUS    . MASTECTOMY Bilateral 2011,2012  . UPPER GI ENDOSCOPY     Social History   Socioeconomic History  . Marital status: Married    Spouse name: Ronalee Belts  . Number of children: 2  . Years of education: some college  . Highest education level: Some college, no degree  Occupational History    Employer: [employer]  Tobacco Use  . Smoking status: Former Smoker    Years: 0.00  . Smokeless tobacco: Never Used  . Tobacco comment: was previously social smoker  Vaping Use  . Vaping Use: Never used  Substance and Sexual Activity  . Alcohol use: No  . Drug use: No  . Sexual activity: Yes  Other Topics Concern  . Not on file  Social History Narrative  . Not on file   Social Determinants of Health   Financial Resource Strain: Not on file  Food Insecurity: Not on file  Transportation Needs: Not on file  Physical Activity: Not on file  Stress: Not on file  Social Connections: Not on file  Intimate Partner Violence: Not on file   Family History  Problem Relation Age of Onset  . Breast  cancer Mother        x2  . Arthritis Mother   . Cancer Mother        breast  . Colon polyps Father   . Diabetes Father   . Hypertension Father   . Cancer Father        skin  . Arthritis Sister   . Healthy Brother   . Healthy Sister   . Healthy Sister   . Colon cancer Paternal Grandfather        Medications: Outpatient Medications Prior to Visit  Medication Sig  . cholecalciferol (VITAMIN D) 1000 UNITS tablet Take 1,000 Units by mouth daily.  Marland Kitchen estradiol (ESTRACE)  0.1 MG/GM vaginal cream PLACE 1 APPLICATORFUL VAGINALLY 3 (THREE) TIMES A WEEK. AT BEDTIME   No facility-administered medications prior to visit.    Review of Systems  Constitutional: Negative for diaphoresis, fever and malaise/fatigue.  Respiratory: Negative for cough, chest tightness and shortness of breath.   Cardiovascular: Positive for palpitations. Negative for chest pain, syncope and near-syncope.  Gastrointestinal: Negative for nausea and vomiting.  Neurological: Negative for dizziness, weakness and numbness.  Psychiatric/Behavioral: The patient is not nervous/anxious.     Last CBC Lab Results  Component Value Date   WBC 4.8 02/01/2020   HGB 13.1 02/01/2020   HCT 38.4 02/01/2020   MCV 89 02/01/2020   MCH 30.3 02/01/2020   RDW 13.0 02/01/2020   PLT 258 02/01/2020   Last metabolic panel Lab Results  Component Value Date   GLUCOSE 81 02/01/2020   NA 140 02/01/2020   K 4.4 02/01/2020   CL 103 02/01/2020   CO2 23 02/01/2020   BUN 14 02/01/2020   CREATININE 0.71 02/01/2020   GFRNONAA 94 02/01/2020   GFRAA 108 02/01/2020   CALCIUM 9.6 02/01/2020   PROT 6.8 02/01/2020   ALBUMIN 4.5 02/01/2020   LABGLOB 2.3 02/01/2020   AGRATIO 2.0 02/01/2020   BILITOT 0.5 02/01/2020   ALKPHOS 121 02/01/2020   AST 13 02/01/2020   ALT 9 02/01/2020   Last lipids Lab Results  Component Value Date   CHOL 242 (H) 02/01/2020   HDL 60 02/01/2020   LDLCALC 164 (H) 02/01/2020   TRIG 105 02/01/2020   CHOLHDL 4.0 02/01/2020   Last thyroid functions Lab Results  Component Value Date   TSH 0.846 02/01/2020        Objective    BP 104/72 (BP Location: Left Arm, Patient Position: Sitting, Cuff Size: Large)   Pulse 67   Temp 98.3 F (36.8 C) (Oral)   Resp 16   Ht 5\' 5"  (1.651 m)   Wt 155 lb 12.8 oz (70.7 kg)   LMP 12/05/2012   SpO2 98%   BMI 25.93 kg/m  BP Readings from Last 3 Encounters:  09/16/20 104/72  02/01/20 106/62  03/29/19 102/66   Wt Readings from Last 3  Encounters:  09/16/20 155 lb 12.8 oz (70.7 kg)  02/01/20 152 lb (68.9 kg)  03/29/19 154 lb 12.8 oz (70.2 kg)      Physical Exam Vitals reviewed.  Constitutional:      General: She is not in acute distress.    Appearance: Normal appearance. She is well-developed. She is not diaphoretic.  HENT:     Head: Normocephalic and atraumatic.  Eyes:     General: No scleral icterus.    Conjunctiva/sclera: Conjunctivae normal.  Neck:     Thyroid: No thyromegaly.  Cardiovascular:     Rate and Rhythm: Normal rate and regular rhythm.  Pulses: Normal pulses.     Heart sounds: Normal heart sounds. No murmur heard.   Pulmonary:     Effort: Pulmonary effort is normal. No respiratory distress.     Breath sounds: Normal breath sounds. No wheezing, rhonchi or rales.  Musculoskeletal:     Cervical back: Neck supple.     Right lower leg: No edema.     Left lower leg: No edema.  Lymphadenopathy:     Cervical: No cervical adenopathy.  Skin:    General: Skin is warm and dry.     Findings: No rash.  Neurological:     Mental Status: She is alert and oriented to person, place, and time. Mental status is at baseline.  Psychiatric:        Mood and Affect: Mood normal.        Behavior: Behavior normal.     EKG: NSR, no signs of ischemia  No results found for any visits on 09/16/20.  Assessment & Plan     Problem List Items Addressed This Visit      Other   Palpitation - Primary    Recurrent issue When she had this 2 years ago, it got better after stopping caffeine intake, so was thought to be PVCs Now not improving with stopping caffeine intake Recheck TSH, CMP, CBC EKG reassuring We will refer to cardiology for cardiac monitor We are not set up to do Zio patch as yet, but I do think she would benefit from one      Relevant Orders   EKG 12-Lead (Completed)   TSH   Comprehensive metabolic panel   CBC   Ambulatory referral to Cardiology       Return if symptoms worsen or fail  to improve.      I, Lavon Paganini, MD, have reviewed all documentation for this visit. The documentation on 09/16/20 for the exam, diagnosis, procedures, and orders are all accurate and complete.   Richey Doolittle, Dionne Bucy, MD, MPH [facility]

## 2020-09-17 LAB — COMPREHENSIVE METABOLIC PANEL
ALT: 13 IU/L (ref 0–32)
AST: 17 IU/L (ref 0–40)
Albumin/Globulin Ratio: 1.8 (ref 1.2–2.2)
Albumin: 4.4 g/dL (ref 3.8–4.9)
Alkaline Phosphatase: 116 IU/L (ref 44–121)
BUN/Creatinine Ratio: 22 (ref 12–28)
BUN: 16 mg/dL (ref 8–27)
Bilirubin Total: 0.4 mg/dL (ref 0.0–1.2)
CO2: 26 mmol/L (ref 20–29)
Calcium: 9.6 mg/dL (ref 8.7–10.3)
Chloride: 104 mmol/L (ref 96–106)
Creatinine, Ser: 0.72 mg/dL (ref 0.57–1.00)
Globulin, Total: 2.5 g/dL (ref 1.5–4.5)
Glucose: 80 mg/dL (ref 65–99)
Potassium: 4.8 mmol/L (ref 3.5–5.2)
Sodium: 142 mmol/L (ref 134–144)
Total Protein: 6.9 g/dL (ref 6.0–8.5)
eGFR: 96 mL/min/{1.73_m2} (ref >59)

## 2020-09-17 LAB — CBC
Hematocrit: 41.3 % (ref 34.0–46.6)
Hemoglobin: 13.5 g/dL (ref 11.1–15.9)
MCH: 28.8 pg (ref 26.6–33.0)
MCHC: 32.7 g/dL (ref 31.5–35.7)
MCV: 88 fL (ref 79–97)
Platelets: 309 10*3/uL (ref 150–450)
RBC: 4.68 x10E6/uL (ref 3.77–5.28)
RDW: 12.8 % (ref 11.7–15.4)
WBC: 4.7 10*3/uL (ref 3.4–10.8)

## 2020-09-17 LAB — TSH: TSH: 0.886 u[IU]/mL (ref 0.450–4.500)

## 2020-09-25 ENCOUNTER — Encounter: Payer: Self-pay | Admitting: Internal Medicine

## 2020-09-25 ENCOUNTER — Other Ambulatory Visit: Payer: Self-pay

## 2020-09-25 ENCOUNTER — Ambulatory Visit (INDEPENDENT_AMBULATORY_CARE_PROVIDER_SITE_OTHER): Payer: Commercial Managed Care - PPO

## 2020-09-25 ENCOUNTER — Ambulatory Visit: Payer: Commercial Managed Care - PPO | Admitting: Internal Medicine

## 2020-09-25 VITALS — BP 110/76 | HR 88 | Ht 65.0 in | Wt 154.0 lb

## 2020-09-25 DIAGNOSIS — R002 Palpitations: Secondary | ICD-10-CM

## 2020-09-25 DIAGNOSIS — I340 Nonrheumatic mitral (valve) insufficiency: Secondary | ICD-10-CM | POA: Diagnosis not present

## 2020-09-25 DIAGNOSIS — I341 Nonrheumatic mitral (valve) prolapse: Secondary | ICD-10-CM

## 2020-09-25 NOTE — Patient Instructions (Signed)
Medication Instructions:  Your physician recommends that you continue on your current medications as directed. Please refer to the Current Medication list given to you today.  *If you need a refill on your cardiac medications before your next appointment, please call your pharmacy*   Lab Work: None ordered If you have labs (blood work) drawn today and your tests are completely normal, you will receive your results only by: Marland Kitchen MyChart Message (if you have MyChart) OR . A paper copy in the mail If you have any lab test that is abnormal or we need to change your treatment, we will call you to review the results.   Testing/Procedures: Your physician has requested that you have an echocardiogram. Echocardiography is a painless test that uses sound waves to create images of your heart. It provides your doctor with information about the size and shape of your heart and how well your heart's chambers and valves are working. This procedure takes approximately one hour. There are no restrictions for this procedure.   Your physician has recommended that you wear a Zio monitor. (To be worn for 14 days) This monitor is a medical device that records the heart's electrical activity. Doctors most often use these monitors to diagnose arrhythmias. Arrhythmias are problems with the speed or rhythm of the heartbeat. The monitor is a small device applied to your chest. You can wear one while you do your normal daily activities. While wearing this monitor if you have any symptoms to push the button and record what you felt. Once you have worn this monitor for the period of time provider prescribed (Usually 14 days), you will return the monitor device in the postage paid box. Once it is returned they will download the data collected and provide [place] with a report which the provider will then review and we will call you with those results. Important tips:  1. Avoid showering during the first 24 hours of wearing the  monitor. 2. Avoid excessive sweating to help maximize wear time. 3. Do not submerge the device, no hot tubs, and no swimming pools. 4. Keep any lotions or oils away from the patch. 5. After 24 hours you may shower with the patch on. Take brief showers with your back facing the shower head.  6. Do not remove patch once it has been placed because that will interrupt data and decrease adhesive wear time. 7. Push the button when you have any symptoms and write down what you were feeling. 8. Once you have completed wearing your monitor, remove and place into box which has postage paid and place in your outgoing mailbox.  9. If for some reason you have misplaced your box then call our office and we can provide another box and/or mail it off for you.         Follow-Up: At [facility], you and your health needs are our priority.  As part of our continuing mission to provide you with exceptional heart care, we have created designated Provider Care Teams.  These Care Teams include your primary Cardiologist (physician) and Advanced Practice Providers (APPs -  Physician Assistants and Nurse Practitioners) who all work together to provide you with the care you need, when you need it.  We recommend signing up for the patient portal called "MyChart".  Sign up information is provided on this After Visit Summary.  MyChart is used to connect with patients for Virtual Visits (Telemedicine).  Patients are able to view lab/test results, encounter notes, upcoming appointments,  etc.  Non-urgent messages can be sent to your provider as well.   To learn more about what you can do with MyChart, go to [email or web address].    Your next appointment:   6 week(s)  The format for your next appointment:   In Person  Provider:   You may see Nelva Bush, MD or one of the following Advanced Practice Providers on your designated Care Team:    Murray Hodgkins, NP  Christell Faith, PA-C  Marrianne Mood,  PA-C  Cadence Mineola, [place]  Laurann Montana, NP    Other Instructions N/A

## 2020-09-25 NOTE — Progress Notes (Signed)
New Outpatient Visit Date: 09/25/2020  Referring Provider: Virginia Crews, MD [address] [place],   [id number]  Chief Complaint: Palpitations  HPI:  [name] is a 60 y.o. female who is being seen today for the evaluation of potation's at the request of Dr. Brita Romp. She has a history of mitral valve prolapse with regurgitation, breast cancer, and GERD.  Over the last 3 weeks, Ms. [name] has experienced almost daily palpitations that she describes as a flipping sensation in her chest.  She is most aware of these when she is sitting or lying down at night.  There is are no clear precipitants.  She had 2 similar spells a few years ago that resolved with caffeine cessation.  She reports minimal caffeine intake at this time.  She wore a heart monitor 10 to 15 years ago but does not recall specifics.  Echocardiogram many years ago was notable for mitral valve prolapse and some mitral regurgitation.  No further intervention was undertaken.  She notes a transient "ache" in her chest with some palpitations.  She denies exertional chest pain as well as shortness of breath, lightheadedness, edema, orthopnea, and PND.  --------------------------------------------------------------------------------------------------  Cardiovascular History & Procedures: Cardiovascular Problems:  Palpitations  Mitral valve prolapse and regurgitation  Risk Factors:  None  Cath/PCI:  None  CV Surgery:  None  EP Procedures and Devices:  None  Non-Invasive Evaluation(s):  None available  Recent CV Pertinent Labs: Lab Results  Component Value Date   CHOL 242 (H) 02/01/2020   HDL 60 02/01/2020   LDLCALC 164 (H) 02/01/2020   TRIG 105 02/01/2020   CHOLHDL 4.0 02/01/2020   INR 1.0 04/25/2013   K 4.8 09/16/2020   BUN 16 09/16/2020   CREATININE 0.72 09/16/2020    --------------------------------------------------------------------------------------------------  Past Medical  History:  Diagnosis Date  . 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  Plan adjuvant chemotherapy.  Marland Kitchen GERD (gastroesophageal reflux disease)   . Heart murmur   . Malignant neoplasm of breast (female), unspecified site 05/11/2010   Invasive ductal carcinoma, extensive DCIS. BRCA negative/ right breast  . Primary cancer of upper outer quadrant of right female breast ([place]) 06/05/2015    Past Surgical History:  Procedure Laterality Date  . ABDOMINAL HYSTERECTOMY  05/04/13   total hysterectomy  . BREAST SURGERY Left 05/27/2011   Left simple mastectomy for benign disease.  Marland Kitchen BREAST SURGERY Right 05/28/2010   Right simple mastectomy/sentinel node biopsy/breast ca  . COLONOSCOPY  2012  . COLONOSCOPY  03/09/2011   Normal exam  . DILATION AND CURETTAGE OF UTERUS    . MASTECTOMY Bilateral 2011,2012  . UPPER GI ENDOSCOPY      Current Meds  Medication Sig  . cholecalciferol (VITAMIN D) 1000 UNITS tablet Take 1,000 Units by mouth daily.  Marland Kitchen estradiol (ESTRACE) 0.1 MG/GM vaginal cream PLACE 1 APPLICATORFUL VAGINALLY 3 (THREE) TIMES A WEEK. AT BEDTIME  . famotidine (PEPCID) 20 MG tablet Take 20 mg by mouth daily as needed for heartburn or indigestion.    Allergies: Iodinated diagnostic agents, Epinephrine, Neomycin, Other, and Penicillins  Social History   Tobacco Use  . Smoking status: Former Smoker    Years: 0.00  . Smokeless tobacco: Never Used  . Tobacco comment: was previously social smoker  Vaping Use  . Vaping Use: Never used  Substance Use Topics  . Alcohol use: No  . Drug use: No  Family History  Problem Relation Age of Onset  . Breast cancer Mother        x2  . Arthritis Mother   . Cancer Mother        breast  . Colon polyps Father   . Diabetes Father   . Hypertension Father   . Cancer Father        skin  . Arthritis Sister   . Healthy Brother   . Healthy Sister   . Healthy  Sister   . Colon cancer Paternal Grandfather     Review of Systems: A 12-system review of systems was performed and was negative except as noted in the HPI.  --------------------------------------------------------------------------------------------------  Physical Exam: BP 110/76 (BP Location: Right Arm, Patient Position: Sitting, Cuff Size: Normal)   Pulse 88   Ht _0  (1.651 m)   Wt 154 lb (69.9 kg)   LMP 12/05/2012   SpO2 96%   BMI 25.63 kg/m   General: NAD. HEENT: No conjunctival pallor or scleral icterus. Facemask in place. Neck: Supple without lymphadenopathy, thyromegaly, JVD, or HJR. No carotid bruit. Lungs: Normal work of breathing. Clear to auscultation bilaterally without wheezes or crackles. Heart: Regular rate and rhythm without murmurs, rubs, or gallops. Non-displaced PMI. Abd: Bowel sounds present. Soft, NT/ND without hepatosplenomegaly Ext: No lower extremity edema. Radial, PT, and DP pulses are 2+ bilaterally Skin: Warm and dry without rash. Neuro: CNIII-XII intact. Strength and fine-touch sensation intact in upper and lower extremities bilaterally. Psych: Normal mood and affect.  EKG: Normal sinus rhythm with nonspecific T wave changes.  Lab Results  Component Value Date   WBC 4.7 09/16/2020   HGB 13.5 09/16/2020   HCT 41.3 09/16/2020   MCV 88 09/16/2020   PLT 309 09/16/2020    Lab Results  Component Value Date   NA 142 09/16/2020   K 4.8 09/16/2020   CL 104 09/16/2020   CO2 26 09/16/2020   BUN 16 09/16/2020   CREATININE 0.72 09/16/2020   GLUCOSE 80 09/16/2020   ALT 13 09/16/2020    Lab Results  Component Value Date   CHOL 242 (H) 02/01/2020   HDL 60 02/01/2020   LDLCALC 164 (H) 02/01/2020   TRIG 105 02/01/2020   CHOLHDL 4.0 02/01/2020   Lab Results  Component Value Date   TSH 0.886 09/16/2020    --------------------------------------------------------------------------------------------------  ASSESSMENT AND  PLAN: Palpitations: Symptoms have been present for the last 3 weeks, happening most days.  I suspect she may be having episodes of PACs or PVCs.  She notes mild discomfort in her chest with some extra beats but otherwise no associated symptoms.  Given her history of mitral valve prolapse and reported mitral regurgitation, she is at risk for arrhythmias.  We have agreed to obtain a 14-day event monitor and transthoracic echocardiogram for further assessment.  We will defer medication changes.  I encouraged Ms. Sarra to continue to minimize her caffeine intake.  Mitral valve prolapse and mitral regurgitation: Reportedly identified by echocardiogram many years ago at [facility] clinic (no details available).  Given aforementioned palpitations, we will obtain an echocardiogram.  Follow-up: Return to clinic in 6 weeks.  Nelva Bush, MD 09/25/2020 8:37 AM

## 2020-09-29 ENCOUNTER — Telehealth: Payer: Self-pay | Admitting: Internal Medicine

## 2020-09-29 NOTE — Telephone Encounter (Signed)
If Ms. Kubicki is having a significant skin reaction to the adhesive and has already had some symptomatic episodes while wearing the monitor, I think it would be best for her to discontinue the monitor at this time and return it to [facility] for review.  Nelva Bush, MD [facility]

## 2020-09-29 NOTE — Telephone Encounter (Signed)
Called patient, she states that the adhesive is causing her to have bumps, red, and itchy. She had her monitor placed on Thursday. She is suppose to wear it for 14 days, but does not think she can continue to wear with the reaction. She did mention that she has had her episodes already in the 4 days, and is not sure if she needs to continue to wear it or not. I did advise it was up to the provider on if he would be okay with 4 days. I did recommend I could send a message to get a sensitive skin adhesive sent out to see if she could finish out the 14 day monitor. Patient verbalized understanding, advised I would call back once I knew what the plan would be to do.  Will route to MD to make aware and give any recommendations.

## 2020-09-29 NOTE — Telephone Encounter (Signed)
Called patient and notified of recommendations per Dr.End.  Patient verbalized understanding.

## 2020-09-29 NOTE — Telephone Encounter (Signed)
Patient calling  States that she had monitor placed at last appointment and she is having an allergic reaction to the adhesive Please call to discuss

## 2020-10-24 ENCOUNTER — Ambulatory Visit (INDEPENDENT_AMBULATORY_CARE_PROVIDER_SITE_OTHER): Payer: Commercial Managed Care - PPO

## 2020-10-24 ENCOUNTER — Other Ambulatory Visit: Payer: Self-pay

## 2020-10-24 DIAGNOSIS — I341 Nonrheumatic mitral (valve) prolapse: Secondary | ICD-10-CM

## 2020-10-25 LAB — ECHOCARDIOGRAM COMPLETE
AR max vel: 2.49 cm2
AV Area VTI: 2.49 cm2
AV Area mean vel: 2.61 cm2
AV Mean grad: 2 mmHg
AV Peak grad: 4.1 mmHg
Ao pk vel: 1.01 m/s
Area-P 1/2: 3.66 cm2
Single Plane A4C EF: 48.3 %

## 2020-11-06 ENCOUNTER — Ambulatory Visit: Payer: Commercial Managed Care - PPO | Admitting: Internal Medicine

## 2020-11-06 ENCOUNTER — Encounter: Payer: Self-pay | Admitting: Internal Medicine

## 2020-11-06 ENCOUNTER — Other Ambulatory Visit: Payer: Self-pay

## 2020-11-06 VITALS — BP 110/70 | HR 75 | Ht 65.0 in | Wt 155.0 lb

## 2020-11-06 DIAGNOSIS — R002 Palpitations: Secondary | ICD-10-CM

## 2020-11-06 DIAGNOSIS — I341 Nonrheumatic mitral (valve) prolapse: Secondary | ICD-10-CM | POA: Diagnosis not present

## 2020-11-06 NOTE — Progress Notes (Signed)
Follow-up Outpatient Visit Date: 11/06/2020  Primary Care Provider: Virginia Crews, MD [address] [place] [address]  Chief Complaint: Follow-up palpitations  HPI:  Ms. Salmons is a 60 y.o. female with history of mitral valve prolapse with mild regurgitation, breast cancer, and GERD, who presents for follow-up of palpitations.  I met her last month, at which time Ms. Statzer was concerned about frequent palpitations.  Subsequent event monitor showed rare PACs and PVCs.  Symptoms were primarily associated with PVCs.  Echocardiogram demonstrated LVEF 55% with grade 1 diastolic dysfunction and mild mitral regurgitation.  Today, Ms. Doell reports that she has been feeling well with minimal palpitations.  She denies chest pain, shortness of breath, lightheadedness, and edema.  --------------------------------------------------------------------------------------------------  Cardiovascular History & Procedures: Cardiovascular Problems:  Palpitations  Mitral valve prolapse and regurgitation  Risk Factors:  None  Cath/PCI:  None  CV Surgery:  None  EP Procedures and Devices:  Event monitor (09/25/2020): Patient was monitored for approximately 4 days.  Monitor showed predominantly sinus rhythm with rare PACs and PVCs.  Symptoms are primarily associated with PVCs.  No significant arrhythmia was identified.  Non-Invasive Evaluation(s):  TTE (10/24/2020): Normal LV size and wall thickness.  LVEF 55% with normal wall motion and grade 1 diastolic dysfunction.  Normal RV size and function.  Normal biatrial size.  Structurally normal mitral valve with mild regurgitation.  Mild tricuspid regurgitation.  Normal aortic valve.  Normal CVP.   Recent CV Pertinent Labs: Lab Results  Component Value Date   CHOL 242 (H) 02/01/2020   HDL 60 02/01/2020   LDLCALC 164 (H) 02/01/2020   TRIG 105 02/01/2020   CHOLHDL 4.0 02/01/2020   INR 1.0 04/25/2013   K 4.8 09/16/2020    BUN 16 09/16/2020   CREATININE 0.72 09/16/2020    Past medical and surgical history were reviewed and updated in EPIC.  Current Meds  Medication Sig  . cholecalciferol (VITAMIN D) 1000 UNITS tablet Take 1,000 Units by mouth daily.  Marland Kitchen estradiol (ESTRACE) 0.1 MG/GM vaginal cream PLACE 1 APPLICATORFUL VAGINALLY 3 (THREE) TIMES A WEEK. AT BEDTIME  . famotidine (PEPCID) 20 MG tablet Take 20 mg by mouth daily as needed for heartburn or indigestion.    Allergies: Iodinated diagnostic agents, Epinephrine, Neomycin, Other, and Penicillins  Social History   Tobacco Use  . Smoking status: Former Smoker    Years: 0.00  . Smokeless tobacco: Never Used  . Tobacco comment: was previously social smoker  Vaping Use  . Vaping Use: Never used  Substance Use Topics  . Alcohol use: No  . Drug use: No    Family History  Problem Relation Age of Onset  . Breast cancer Mother        x2  . Arthritis Mother   . Cancer Mother        breast  . Colon polyps Father   . Diabetes Father   . Hypertension Father   . Cancer Father        skin  . Kidney disease Father   . Arthritis Sister   . Healthy Brother   . Healthy Sister   . Healthy Sister   . Colon cancer Paternal Grandfather     Review of Systems: A 12-system review of systems was performed and was negative except as noted in the HPI.  --------------------------------------------------------------------------------------------------  Physical Exam: BP 110/70 (BP Location: Left Arm, Patient Position: Sitting, Cuff Size: Normal)   Pulse 75   Ht $R'5\' 5"'KV$  (  1.651 m)   Wt 155 lb (70.3 kg)   LMP 12/05/2012   SpO2 98%   BMI 25.79 kg/m   General:  NAD. Neck: No JVD or HJR. Lungs: Clear to auscultation bilaterally without wheezes or crackles. Heart: Regular rate and rhythm without murmurs, rubs, or gallops. Abdomen: Soft, nontender, nondistended. Extremities: No lower extremity edema.  Lab Results  Component Value Date   WBC 4.7  09/16/2020   HGB 13.5 09/16/2020   HCT 41.3 09/16/2020   MCV 88 09/16/2020   PLT 309 09/16/2020    Lab Results  Component Value Date   NA 142 09/16/2020   K 4.8 09/16/2020   CL 104 09/16/2020   CO2 26 09/16/2020   BUN 16 09/16/2020   CREATININE 0.72 09/16/2020   GLUCOSE 80 09/16/2020   ALT 13 09/16/2020    Lab Results  Component Value Date   CHOL 242 (H) 02/01/2020   HDL 60 02/01/2020   LDLCALC 164 (H) 02/01/2020   TRIG 105 02/01/2020   CHOLHDL 4.0 02/01/2020    --------------------------------------------------------------------------------------------------  ASSESSMENT AND PLAN: Palpitations: Recent monitor showed rare PACs and PVCs.  Symptoms have improved since last visit.  I reassured Ms. Levey that monitor findings are benign.  I have encouraged her to continue to minimize caffeine and alcohol intake.  No further intervention or testing is recommended at this time.  Mitral valve prolapse: Recent echo showed structurally normal mitral valve with mild regurgitation.  Ms. Azizi does not have signs or symptoms of heart failure.  No further testing or intervention is recommended at this time.  Follow-up: Return to clinic as needed.  Nelva Bush, MD 11/07/2020 4:04 PM

## 2020-11-06 NOTE — Patient Instructions (Signed)
Medication Instructions:  No changes at this time.  *If you need a refill on your cardiac medications before your next appointment, please call your pharmacy*   Lab Work: None  If you have labs (blood work) drawn today and your tests are completely normal, you will receive your results only by: Marland Kitchen MyChart Message (if you have MyChart) OR . A paper copy in the mail If you have any lab test that is abnormal or we need to change your treatment, we will call you to review the results.   Testing/Procedures: None   Follow-Up: At [facility], you and your health needs are our priority.  As part of our continuing mission to provide you with exceptional heart care, we have created designated Provider Care Teams.  These Care Teams include your primary Cardiologist (physician) and Advanced Practice Providers (APPs -  Physician Assistants and Nurse Practitioners) who all work together to provide you with the care you need, when you need it.   Your next appointment:   As Needed.

## 2020-11-07 ENCOUNTER — Encounter: Payer: Self-pay | Admitting: Internal Medicine

## 2021-02-13 ENCOUNTER — Encounter: Payer: Self-pay | Admitting: Family Medicine

## 2021-02-13 ENCOUNTER — Other Ambulatory Visit: Payer: Self-pay

## 2021-02-13 ENCOUNTER — Ambulatory Visit (INDEPENDENT_AMBULATORY_CARE_PROVIDER_SITE_OTHER): Payer: Commercial Managed Care - PPO | Admitting: Family Medicine

## 2021-02-13 VITALS — BP 114/63 | HR 69 | Temp 98.1°F | Resp 16 | Ht 65.0 in | Wt 146.8 lb

## 2021-02-13 DIAGNOSIS — E559 Vitamin D deficiency, unspecified: Secondary | ICD-10-CM

## 2021-02-13 DIAGNOSIS — Z23 Encounter for immunization: Secondary | ICD-10-CM

## 2021-02-13 DIAGNOSIS — E7800 Pure hypercholesterolemia, unspecified: Secondary | ICD-10-CM

## 2021-02-13 DIAGNOSIS — Z0000 Encounter for general adult medical examination without abnormal findings: Secondary | ICD-10-CM

## 2021-02-13 MED ORDER — FAMOTIDINE 20 MG PO TABS: 20.0000 mg | ORAL_TABLET | Freq: Every day | ORAL | 3 refills | Status: DC | PRN

## 2021-02-13 NOTE — Progress Notes (Signed)
Complete physical exam   Patient: Donna Hoffman   DOB: 1960-11-12   60 y.o. Female  MRN: [id number] Visit Date: 02/13/2021  Today's healthcare provider: Lavon Paganini, MD   Chief Complaint  Patient presents with   Annual Exam   Subjective    Donna Hoffman is a 60 y.o. female who presents today for a complete physical exam.  She reports consuming a  diet. She generally feels well. She reports sleeping well. She does have additional problems to discuss today.   HPI   Memory Loss She noticed trouble with memory loss and would like to get tested.  Screening 06/30/17 Pap-Hysterectomy 05/04/13 07/12/17 Mammogram-Mastectomy bilateral  she no longer receives mammograms  03/09/11 Colonoscopy: She is scheduled 03/23/21   Vaccines She has had 3 COVID vaccines. She is allergic to neomycin and when she is exposed she breaks out in blisters. She has concerns about the shingrix vaccine contained with neomycin. She is amenable to receive the 1st shingrix today. She is current on tetanus.  Medication She is requesting for a refill of famotidine 20 mg.  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  Plan adjuvant chemotherapy.   GERD (gastroesophageal reflux disease)    Heart murmur    Malignant neoplasm of breast (female), unspecified site 05/11/2010   Invasive ductal carcinoma, extensive DCIS. BRCA negative/ right breast   Primary cancer of upper outer quadrant of right female breast ([place]) 06/05/2015   Past Surgical History:  Procedure Laterality Date   ABDOMINAL HYSTERECTOMY  05/04/13   total hysterectomy   BREAST SURGERY Left 05/27/2011   Left simple mastectomy for benign disease.   BREAST SURGERY Right 05/28/2010   Right simple mastectomy/sentinel node biopsy/breast ca   COLONOSCOPY  2012   COLONOSCOPY  03/09/2011   Normal exam   DILATION AND CURETTAGE  OF UTERUS     MASTECTOMY Bilateral 2011,2012   UPPER GI ENDOSCOPY     Social History   Socioeconomic History   Marital status: Married    Spouse name: Ronalee Belts   Number of children: 2   Years of education: some college   Highest education level: Some college, no degree  Occupational History    Employer: [employer]  Tobacco Use   Smoking status: Former    Years: 0.00    Types: Cigarettes   Smokeless tobacco: Never   Tobacco comments:    was previously social smoker  Scientific laboratory technician Use: Never used  Substance and Sexual Activity   Alcohol use: No   Drug use: No   Sexual activity: Yes  Other Topics Concern   Not on file  Social History Narrative   Not on file   Social Determinants of Health   Financial Resource Strain: Not on file  Food Insecurity: Not on file  Transportation Needs: Not on file  Physical Activity: Not on file  Stress: Not on file  Social Connections: Not on file  Intimate Partner Violence: Not on file   Family Status  Relation Name Status   Mother  Alive   Father  Alive   Sister  Alive   Brother  Alive   Sister  Alive   Sister  Alive   PGF  (Not Specified)   Family History  Problem Relation Age of Onset   Breast cancer Mother  x2   Arthritis Mother    Cancer Mother        breast   Colon polyps Father    Diabetes Father    Hypertension Father    Cancer Father        skin   Kidney disease Father    Arthritis Sister    Healthy Brother    Healthy Sister    Healthy Sister    Colon cancer Paternal Grandfather    Allergies  Allergen Reactions   Iodinated Diagnostic Agents Swelling    Eye swelling   Epinephrine    Neomycin Hives   Other Other (See Comments)    polymycin - blisters   Penicillins Itching    Patient Care Team: Virginia Crews, MD as PCP - General (Family Medicine) Robert Bellow, MD (General Surgery)   Medications: Outpatient Medications Prior to Visit  Medication Sig   cholecalciferol  (VITAMIN D) 1000 UNITS tablet Take 1,000 Units by mouth daily.   estradiol (ESTRACE) 0.1 MG/GM vaginal cream PLACE 1 APPLICATORFUL VAGINALLY 3 (THREE) TIMES A WEEK. AT BEDTIME   [DISCONTINUED] famotidine (PEPCID) 20 MG tablet Take 20 mg by mouth daily as needed for heartburn or indigestion.   No facility-administered medications prior to visit.    Review of Systems  Constitutional:  Negative for chills, fatigue and fever.  HENT:  Negative for ear pain, sinus pressure, sinus pain and sore throat.   Eyes:  Negative for pain and visual disturbance.  Respiratory:  Negative for apnea, cough, choking, shortness of breath and wheezing.   Cardiovascular:  Negative for chest pain, palpitations and leg swelling.  Gastrointestinal:  Negative for abdominal pain, blood in stool, constipation, diarrhea, nausea and vomiting.  Genitourinary:  Negative for dysuria, flank pain, frequency, pelvic pain and urgency.  Musculoskeletal:  Negative for back pain, myalgias and neck pain.  Neurological:  Positive for facial asymmetry. Negative for dizziness, seizures, syncope, weakness, light-headedness, numbness and headaches.     Objective    BP 114/63 (BP Location: Right Arm, Patient Position: Sitting, Cuff Size: Normal)   Pulse 69   Temp 98.1 F (36.7 C) (Oral)   Resp 16   Ht $R'5\' 5"'XF$  (1.651 m)   Wt 146 lb 12.8 oz (66.6 kg)   LMP 12/05/2012   BMI 24.43 kg/m    Physical Exam Vitals reviewed.  Constitutional:      General: She is not in acute distress.    Appearance: Normal appearance. She is well-developed. She is not diaphoretic.  HENT:     Head: Normocephalic and atraumatic.     Right Ear: Tympanic membrane, ear canal and external ear normal.     Left Ear: Tympanic membrane, ear canal and external ear normal.     Nose: Nose normal.     Mouth/Throat:     Mouth: Mucous membranes are moist.     Pharynx: Oropharynx is clear. No oropharyngeal exudate.  Eyes:     General: No scleral icterus.     Conjunctiva/sclera: Conjunctivae normal.     Pupils: Pupils are equal, round, and reactive to light.  Neck:     Thyroid: No thyromegaly.  Cardiovascular:     Rate and Rhythm: Normal rate and regular rhythm.     Pulses: Normal pulses.     Heart sounds: Normal heart sounds. No murmur heard. Pulmonary:     Effort: Pulmonary effort is normal. No respiratory distress.     Breath sounds: Normal breath sounds. No wheezing or rales.  Abdominal:  General: There is no distension.     Palpations: Abdomen is soft.     Tenderness: There is no abdominal tenderness.  Musculoskeletal:        General: No deformity.     Cervical back: Neck supple.     Right lower leg: No edema.     Left lower leg: No edema.  Lymphadenopathy:     Cervical: No cervical adenopathy.  Skin:    General: Skin is warm and dry.     Findings: No rash.  Neurological:     Mental Status: She is alert and oriented to person, place, and time. Mental status is at baseline.     Sensory: No sensory deficit.     Motor: No weakness.     Gait: Gait normal.  Psychiatric:        Mood and Affect: Mood normal.        Behavior: Behavior normal.        Thought Content: Thought content normal.      Last depression screening scores PHQ 2/9 Scores 02/13/2021 09/16/2020 02/01/2020  PHQ - 2 Score 0 0 0  PHQ- 9 Score 0 2 1   Last fall risk screening Fall Risk  02/13/2021  Falls in the past year? 0  Number falls in past yr: 0  Injury with Fall? 0  Risk for fall due to : No Fall Risks  Follow up Falls evaluation completed   Last Audit-C alcohol use screening Alcohol Use Disorder Test (AUDIT) 02/13/2021  1. How often do you have a drink containing alcohol? 0  2. How many drinks containing alcohol do you have on a typical day when you are drinking? 0  3. How often do you have six or more drinks on one occasion? 0  AUDIT-C Score 0  Alcohol Brief Interventions/Follow-up -   A score of 3 or more in women, and 4 or more in men indicates  increased risk for alcohol abuse, EXCEPT if all of the points are from question 1   No results found for any visits on 02/13/21.  Assessment & Plan     Problem List Items Addressed This Visit       Other   Hypercholesterolemia    Reviewed last lipid panel Not currently on a statin Recheck FLP and CMP Discussed diet and exercise       Relevant Orders   Comprehensive metabolic panel   Lipid Panel With LDL/HDL Ratio   Avitaminosis D   Relevant Orders   VITAMIN D 25 Hydroxy (Vit-D Deficiency, Fractures)   Other Visit Diagnoses     Annual physical exam    -  Primary   Relevant Orders   Comprehensive metabolic panel   Lipid Panel With LDL/HDL Ratio   Hemoglobin A1c   Flu vaccine need       Relevant Orders   Flu Vaccine QUAD 30mo+IM (Fluarix, Fluzone & Alfiuria Quad PF)       Routine Health Maintenance and Physical Exam  Exercise Activities and Dietary recommendations  Goals      Exercise 150 minutes per week (moderate activity)        Immunization History  Administered Date(s) Administered   Moderna Sars-Covid-2 Vaccination 04/18/2020   PFIZER(Purple Top)SARS-COV-2 Vaccination 06/18/2019, 07/14/2019   Td 03/11/1997   Tdap 02/21/2007, 01/21/2017    Health Maintenance  Topic Date Due   Zoster Vaccines- Shingrix (1 of 2) Never done   COVID-19 Vaccine (4 - Booster for Pfizer series) 08/16/2020   INFLUENZA  VACCINE  01/12/2021   COLONOSCOPY (Pts 45-71yrs Insurance coverage will need to be confirmed)  03/08/2021   TETANUS/TDAP  01/22/2027   Hepatitis C Screening  Completed   HIV Screening  Completed   Pneumococcal Vaccine 92-53 Years old  Aged Out   HPV VACCINES  Aged Out    Discussed health benefits of physical activity, and encouraged her to engage in regular exercise appropriate for her age and condition.    Return in about 1 year (around 02/13/2022) for CPE.     I,Essence Turner,acting as a Education administrator for Lavon Paganini, MD.,have documented all relevant  documentation on the behalf of Lavon Paganini, MD,as directed by  Lavon Paganini, MD while in the presence of Lavon Paganini, MD.   I, Lavon Paganini, MD, have reviewed all documentation for this visit. The documentation on 02/13/21 for the exam, diagnosis, procedures, and orders are all accurate and complete.   Gloria Ricardo, Dionne Bucy, MD, MPH [facility]

## 2021-02-13 NOTE — Assessment & Plan Note (Signed)
Reviewed last lipid panel Not currently on a statin Recheck FLP and CMP Discussed diet and exercise  

## 2021-02-14 LAB — COMPREHENSIVE METABOLIC PANEL
ALT: 11 IU/L (ref 0–32)
AST: 20 IU/L (ref 0–40)
Albumin/Globulin Ratio: 1.8 (ref 1.2–2.2)
Albumin: 4.3 g/dL (ref 3.8–4.9)
Alkaline Phosphatase: 114 IU/L (ref 44–121)
BUN/Creatinine Ratio: 17 (ref 12–28)
BUN: 12 mg/dL (ref 8–27)
Bilirubin Total: 0.5 mg/dL (ref 0.0–1.2)
CO2: 26 mmol/L (ref 20–29)
Calcium: 9.5 mg/dL (ref 8.7–10.3)
Chloride: 103 mmol/L (ref 96–106)
Creatinine, Ser: 0.72 mg/dL (ref 0.57–1.00)
Globulin, Total: 2.4 g/dL (ref 1.5–4.5)
Glucose: 87 mg/dL (ref 65–99)
Potassium: 4.4 mmol/L (ref 3.5–5.2)
Sodium: 142 mmol/L (ref 134–144)
Total Protein: 6.7 g/dL (ref 6.0–8.5)
eGFR: 96 mL/min/{1.73_m2} (ref >59)

## 2021-02-14 LAB — LIPID PANEL WITH LDL/HDL RATIO
Cholesterol, Total: 198 mg/dL (ref 100–199)
HDL: 46 mg/dL (ref >39)
LDL Chol Calc (NIH): 133 mg/dL — ABNORMAL HIGH (ref 0–99)
LDL/HDL Ratio: 2.9 ratio (ref 0.0–3.2)
Triglycerides: 104 mg/dL (ref 0–149)
VLDL Cholesterol Cal: 19 mg/dL (ref 5–40)

## 2021-02-14 LAB — HEMOGLOBIN A1C
Est. average glucose Bld gHb Est-mCnc: 108 mg/dL
Hgb A1c MFr Bld: 5.4 % (ref 4.8–5.6)

## 2021-02-14 LAB — VITAMIN D 25 HYDROXY (VIT D DEFICIENCY, FRACTURES): Vit D, 25-Hydroxy: 33 ng/mL (ref 30.0–100.0)

## 2021-03-02 ENCOUNTER — Telehealth: Payer: Self-pay

## 2021-03-02 NOTE — Telephone Encounter (Signed)
Copied from [place] [id number]. Topic: General - Other >> Mar 02, 2021 12:26 PM Erick Blinks wrote: Reason for CRM: Pt called requesting for office to fax office her CPE information to her insurance that way her premiums stay low   Best contact: [phone] (requesting a call back)

## 2021-03-05 NOTE — Telephone Encounter (Signed)
I contacted patient she states that when she came in office to see Dr. Brita Romp on 02/13/21 she brought in a physical form that provider was suppose to send back to her insurance along with office note. I do not see a copy of this form in chart. Have you seen a form on this patient to be scanned? KW

## 2021-03-10 NOTE — Telephone Encounter (Signed)
Pt upset no one has called her back, and the form has probably been misplaced. She is going to bring another form by and wants to fax herself.

## 2021-03-12 ENCOUNTER — Other Ambulatory Visit: Payer: Self-pay | Admitting: General Surgery

## 2021-03-12 NOTE — Progress Notes (Signed)
Subjective:     Patient ID: Donna Hoffman is a 60 y.o. female.   HPI   The following portions of the patient's history were reviewed and updated as appropriate.   This an established patient is here today for: office visit. Here for her follow up annual follow up post left mastectomy in 2012, and right mastectomy in 2011. No new issues.   Review of Systems  Constitutional: Negative for chills and fever.  Respiratory: Negative for cough.           Chief Complaint  Patient presents with   Follow-up      breast cancer      BP 118/80   Pulse 74   Temp 36.4 C (97.6 F)   Ht 165.1 cm (_0 )   Wt 70.3 kg (155 lb)   SpO2 97%   BMI 25.79 kg/m        Past Medical History:  Diagnosis Date   BRCA gene mutation negative     Cardiac murmur     GERD (gastroesophageal reflux disease)     Malignant neoplasm of right female breast (CMS-HCC) 05/11/2010    1.5 cm invasive mammary cancer, ER/PR: Neg, Her 2 Neu over-expressed.            Past Surgical History:  Procedure Laterality Date   ABDOMINAL HYSTERECTOMY   05/04/2013   COLONOSCOPY   03/09/2011   dilation and curettage of uterus       EGD       excision chest wall mass Right 10/25/2019   MASTECTOMY SIMPLE Left 05/27/2011    for benign disease   MASTECTOMY SIMPLE Right 05/28/2010    T1c,N), ER/PR Neg, Her 2 Neu +. Declined chemotherapy.                 OB History     Gravida  2   Para  2   Term      Preterm      AB      Living         SAB      IAB      Ectopic      Molar      Multiple      Live Births           Obstetric Comments  Age at first period 77 Age of first pregnancy 33 Age menopause 54           Social History           Socioeconomic History   Marital status: Married      Spouse name: Not on file   Number of children: Not on file   Years of education: Not on file   Highest education level: Not on file  Occupational History   Not on file  Tobacco Use   Smoking status:  Former Smoker      Years: 1.00   Smokeless tobacco: Never Used   Tobacco comment: was previously a social smoker  Substance and Sexual Activity   Alcohol use: Yes      Comment: occassionally   Drug use: Not on file   Sexual activity: Not on file  Other Topics Concern   Not on file  Social History Narrative   Not on file    Social Determinants of Health    Financial Resource Strain: Not on file  Food Insecurity: Not on file  Transportation Needs: Not on file  Allergies  Allergen Reactions   Iodinated Contrast Media Other (See Comments)      Eye swelling   Epinephrine Unknown   Neomycin Hives   Penicillin Itching   Polymycin [Neomycin-Polymyxin] Other (See Comments)      Blisters      Current Medications        Current Outpatient Medications  Medication Sig Dispense Refill   cholecalciferol (VITAMIN D3) 1000 unit tablet Take by mouth once daily       estradioL (ESTRACE) 0.01 % (0.1 mg/gram) vaginal cream as directed        No current facility-administered medications for this visit.             Family History  Problem Relation Age of Onset   Rheum arthritis Mother     Breast cancer Mother     Arthritis Mother     Aneurysm Mother          brain   High blood pressure (Hypertension) Father     Diabetes Father     Colon polyps Father     Skin cancer Father     Kidney disease Father     Arthritis Sister     Colon cancer Paternal Grandfather     Colon cancer Nephew     Kidney cancer Nephew             Objective:   Physical Exam Exam conducted with a chaperone present.  Constitutional:      Appearance: Normal appearance.  Cardiovascular:     Rate and Rhythm: Normal rate and regular rhythm.     Pulses: Normal pulses.     Heart sounds: Normal heart sounds.  Pulmonary:     Effort: Pulmonary effort is normal.     Breath sounds: Normal breath sounds.  Chest:  Breasts:     Right: Absent. No axillary adenopathy or supraclavicular adenopathy.      Left: Absent. No axillary adenopathy or supraclavicular adenopathy.         Comments: bilateral mastectomy  Musculoskeletal:     Cervical back: Neck supple.  Lymphadenopathy:     Upper Body:     Right upper body: No supraclavicular or axillary adenopathy.     Left upper body: No supraclavicular or axillary adenopathy.  Skin:    General: Skin is warm and dry.  Neurological:     Mental Status: She is alert and oriented to person, place, and time.  Psychiatric:        Mood and Affect: Mood normal.        Behavior: Behavior normal.      Labs and Radiology:      Colonoscopy dated March 09, 2011 was normal.  10-year follow-up recommended.      Assessment:     Doing well now 10 years status post mastectomy for ER/PR negative HER2 positive cancer.   Candidate for follow-up: Screening in 2022.    Plan:     Option for colon screening reviewed 1) colonoscopy versus 2) Cologuard.  Pros and cons of each were reviewed.  At this time, the patient is leaning towards colonoscopy.   Prescription for breast prostheses and surgical bras provided.   Clinical exam in 1 year.    Patient to follow up as scheduled and is aware to call for any new issues or concerns.   Colonoscopy due 2022   Follow up in one year.   Entered by Karie Fetch, RN, acting as a scribe for Dr. Hervey Ard, MD.  The documentation recorded by the scribe accurately reflects the service I personally performed and the decisions made by me.    Robert Bellow, MD FACS

## 2021-03-13 NOTE — Telephone Encounter (Signed)
Form completed and given to front office staff to submit.

## 2021-03-13 NOTE — Telephone Encounter (Signed)
Patient brought form in today.

## 2021-03-13 NOTE — Telephone Encounter (Signed)
Form faxed and called pt to let her know per pt's request.

## 2021-03-20 ENCOUNTER — Encounter: Payer: Self-pay | Admitting: General Surgery

## 2021-03-23 ENCOUNTER — Encounter: Payer: Self-pay | Admitting: General Surgery

## 2021-03-23 ENCOUNTER — Ambulatory Visit
Admission: RE | Admit: 2021-03-23 | Discharge: 2021-03-23 | Disposition: A | Discharge status: Home / Self Care | Payer: Commercial Managed Care - PPO | Attending: General Surgery | Admitting: General Surgery

## 2021-03-23 ENCOUNTER — Encounter
Admission: RE | Disposition: A | Discharge status: Home / Self Care | Payer: Self-pay | Source: Home / Self Care | Attending: General Surgery

## 2021-03-23 ENCOUNTER — Ambulatory Visit: Payer: Commercial Managed Care - PPO | Admitting: Anesthesiology

## 2021-03-23 DIAGNOSIS — K621 Rectal polyp: Secondary | ICD-10-CM | POA: Insufficient documentation

## 2021-03-23 DIAGNOSIS — Z880 Allergy status to penicillin: Secondary | ICD-10-CM | POA: Insufficient documentation

## 2021-03-23 DIAGNOSIS — Z1211 Encounter for screening for malignant neoplasm of colon: Secondary | ICD-10-CM | POA: Diagnosis not present

## 2021-03-23 DIAGNOSIS — Z87891 Personal history of nicotine dependence: Secondary | ICD-10-CM | POA: Insufficient documentation

## 2021-03-23 DIAGNOSIS — Z79899 Other long term (current) drug therapy: Secondary | ICD-10-CM | POA: Insufficient documentation

## 2021-03-23 DIAGNOSIS — Z853 Personal history of malignant neoplasm of breast: Secondary | ICD-10-CM | POA: Insufficient documentation

## 2021-03-23 DIAGNOSIS — K219 Gastro-esophageal reflux disease without esophagitis: Secondary | ICD-10-CM | POA: Diagnosis not present

## 2021-03-23 HISTORY — PX: COLONOSCOPY WITH PROPOFOL: SHX5780

## 2021-03-23 SURGERY — COLONOSCOPY WITH PROPOFOL
Anesthesia: Monitor Anesthesia Care

## 2021-03-23 MED ORDER — DEXMEDETOMIDINE (PRECEDEX) IN NS 20 MCG/5ML (4 MCG/ML) IV SYRINGE
PREFILLED_SYRINGE | INTRAVENOUS | Status: DC | PRN
  Administered 2021-03-23: 8 ug via INTRAVENOUS

## 2021-03-23 MED ORDER — PHENYLEPHRINE HCL (PRESSORS) 10 MG/ML IV SOLN
INTRAVENOUS | Status: DC | PRN
  Administered 2021-03-23: 200 ug via INTRAVENOUS
  Administered 2021-03-23 (×2): 100 ug via INTRAVENOUS

## 2021-03-23 MED ORDER — PROPOFOL 10 MG/ML IV BOLUS
INTRAVENOUS | Status: DC | PRN
  Administered 2021-03-23: 20 mg via INTRAVENOUS
  Administered 2021-03-23: 90 mg via INTRAVENOUS
  Administered 2021-03-23 (×2): 10 mg via INTRAVENOUS

## 2021-03-23 MED ORDER — SODIUM CHLORIDE 0.9 % IV SOLN
INTRAVENOUS | Status: DC
  Administered 2021-03-23: 1000 mL via INTRAVENOUS

## 2021-03-23 MED ORDER — PROPOFOL 500 MG/50ML IV EMUL
INTRAVENOUS | Status: DC | PRN
  Administered 2021-03-23: 150 ug/kg/min via INTRAVENOUS

## 2021-03-23 MED ORDER — LIDOCAINE HCL (CARDIAC) PF 100 MG/5ML IV SOSY
PREFILLED_SYRINGE | INTRAVENOUS | Status: DC | PRN
  Administered 2021-03-23: 40 mg via INTRAVENOUS

## 2021-03-23 NOTE — Transfer of Care (Signed)
Immediate Anesthesia Transfer of Care Note  Patient: Donna Hoffman  Procedure(s) Performed: Procedure(s): COLONOSCOPY WITH PROPOFOL (N/A)  Patient Location: PACU and Endoscopy Unit  Anesthesia Type:General  Level of Consciousness: sedated  Airway & Oxygen Therapy: Patient Spontanous Breathing and Patient connected to nasal cannula oxygen  Post-op Assessment: Report given to RN and Post -op Vital signs reviewed and stable  Post vital signs: Reviewed and stable  Last Vitals:  Vitals:   03/23/21 0704 03/23/21 0838  BP: (!) 146/65 (!) 103/55  Pulse: 80 85  Resp: 16 17  Temp: 36.8 C   SpO2: 244% 62%    Complications: No apparent anesthesia complications

## 2021-03-23 NOTE — Anesthesia Preprocedure Evaluation (Signed)
Anesthesia Evaluation  Patient identified by MRN, date of birth, ID band Patient awake    Reviewed: Allergy & Precautions, NPO status , Patient's Chart, lab work & pertinent test results  History of Anesthesia Complications Negative for: history of anesthetic complications  Airway Mallampati: I  TM Distance: >3 FB Neck ROM: Full    Dental no notable dental hx. (+) Teeth Intact   Pulmonary neg pulmonary ROS, former smoker,    Pulmonary exam normal breath sounds clear to auscultation       Cardiovascular Exercise Tolerance: Good METS: 3 - Mets Normal cardiovascular exam+ Valvular Problems/Murmurs  Rhythm:Regular Rate:Normal     Neuro/Psych  Neuromuscular disease (lumbar radic hx) negative psych ROS   GI/Hepatic Neg liver ROS, GERD  Controlled and Medicated,  Endo/Other  negative endocrine ROS  Renal/GU negative Renal ROS  negative genitourinary   Musculoskeletal negative musculoskeletal ROS (+)   Abdominal   Peds  Hematology negative hematology ROS (+)   Anesthesia Other Findings   Reproductive/Obstetrics negative OB ROS                             Anesthesia Physical Anesthesia Plan  ASA: 2  Anesthesia Plan: MAC   Post-op Pain Management:    Induction: Intravenous  PONV Risk Score and Plan:   Airway Management Planned: Natural Airway and Nasal Cannula  Additional Equipment:   Intra-op Plan:   Post-operative Plan:   Informed Consent: I have reviewed the patients History and Physical, chart, labs and discussed the procedure including the risks, benefits and alternatives for the proposed anesthesia with the patient or authorized representative who has indicated his/her understanding and acceptance.     Dental Advisory Given  Plan Discussed with: Anesthesiologist, CRNA and Surgeon  Anesthesia Plan Comments: (Patient consented for risks of anesthesia including but not  limited to:  - adverse reactions to medications - damage to eyes, teeth, lips or other oral mucosa - nerve damage due to positioning  - sore throat or hoarseness - Damage to heart, brain, nerves, lungs, other parts of body or loss of life  Patient voiced understanding.)        Anesthesia Quick Evaluation

## 2021-03-23 NOTE — Op Note (Signed)
[facility] Patient Name: Donna Hoffman Procedure Date: 03/23/2021 7:15 AM MRN: [id number] Account #: [id number] Date of Birth: 04-25-1961 Admit Type: Outpatient Age: 60 Room: [facility] ENDO ROOM 2 Gender: Female Note Status: Finalized Instrument Name: Peds Colonoscope [id number] Procedure:             Colonoscopy Indications:           Screening for colorectal malignant neoplasm Providers:             Robert Bellow, MD Referring MD:          Dionne Bucy. Bacigalupo (Referring MD) Medicines:             Propofol per Anesthesia Complications:         No immediate complications. Procedure:             Pre-Anesthesia Assessment:                        - Prior to the procedure, a History and Physical was                         performed, and patient medications, allergies and                         sensitivities were reviewed. The patient's tolerance                         of previous anesthesia was reviewed.                        - The risks and benefits of the procedure and the                         sedation options and risks were discussed with the                         patient. All questions were answered and informed                         consent was obtained.                        After obtaining informed consent, the colonoscope was                         passed under direct vision. Throughout the procedure,                         the patient's blood pressure, pulse, and oxygen                         saturations were monitored continuously. The                         Colonoscope was introduced through the anus and                         advanced to the the cecum, identified by appendiceal  orifice and ileocecal valve. The colonoscopy was                         somewhat difficult due to significant looping.                         Successful completion of the procedure was aided by                          applying abdominal pressure. The patient tolerated the                         procedure well. The quality of the bowel preparation                         was good. Findings:      Two sessile polyps were found in the rectum. The polyps were 5 mm in       size. These polyps were removed with a jumbo cold forceps. Resection and       retrieval were complete.      The retroflexed view of the distal rectum and anal verge was normal and       showed no anal or rectal abnormalities. Impression:            - Two 5 mm polyps in the rectum, removed with a jumbo                         cold forceps. Resected and retrieved.                        - The distal rectum and anal verge are normal on                         retroflexion view. Recommendation:        - Telephone endoscopist for pathology results in 1                         week. Procedure Code(s):     --- Professional ---                        [phone], Colonoscopy, flexible; with biopsy, single or                         multiple Diagnosis Code(s):     --- Professional ---                        Z12.11, Encounter for screening for malignant neoplasm                         of colon                        K62.1, Rectal polyp CPT copyright 2019 American Medical Association. All rights reserved. The codes documented in this report are preliminary and upon coder review may  be revised to meet current compliance requirements. Robert Bellow, MD 03/23/2021 8:34:40 AM This report has been signed electronically. Number of Addenda: 0 Note Initiated On: 03/23/2021 7:15 AM Scope Withdrawal Time:  0 hours 19 minutes 41 seconds  Total Procedure Duration: 0 hours 31 minutes 48 seconds  Estimated Blood Loss:  Estimated blood loss: none.      [facility]

## 2021-03-23 NOTE — Anesthesia Procedure Notes (Signed)
Date/Time: 03/23/2021 7:56 AM Performed by: Doreen Salvage, CRNA Pre-anesthesia Checklist: Patient identified, Emergency Drugs available, Suction available and Patient being monitored Patient Re-evaluated:Patient Re-evaluated prior to induction Oxygen Delivery Method: Nasal cannula Induction Type: IV induction Dental Injury: Teeth and Oropharynx as per pre-operative assessment  Comments: Nasal cannula with etCO2 monitoring

## 2021-03-23 NOTE — H&P (Signed)
Donna Hoffman [id number] Mar 13, 1961     HPI:  Healthy 60 y/o for screening colonoscopy. Reports it took about 6 hours after starting her prep for her bowels to begin to move. Since then, free flowing.     Medications Prior to Admission  Medication Sig Dispense Refill Last Dose   cholecalciferol (VITAMIN D) 1000 UNITS tablet Take 1,000 Units by mouth daily.   Past Week   estradiol (ESTRACE) 0.1 MG/GM vaginal cream PLACE 1 APPLICATORFUL VAGINALLY 3 (THREE) TIMES A WEEK. AT BEDTIME 127.5 g 5 Past Week   famotidine (PEPCID) 20 MG tablet Take 1 tablet (20 mg total) by mouth daily as needed for heartburn or indigestion. 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  Plan adjuvant chemotherapy.   GERD (gastroesophageal reflux disease)    Heart murmur    Malignant neoplasm of breast (female), unspecified site 05/11/2010   Invasive ductal carcinoma, extensive DCIS. BRCA negative/ right breast   Primary cancer of upper outer quadrant of right female breast ([place]) 06/05/2015   Past Surgical History:  Procedure Laterality Date   ABDOMINAL HYSTERECTOMY  05/04/2013   total hysterectomy   BREAST SURGERY Left 05/27/2011   Left simple mastectomy for benign disease.   BREAST SURGERY Right 05/28/2010   Right simple mastectomy/sentinel node biopsy/breast ca   COLONOSCOPY  2012   COLONOSCOPY  03/09/2011   Normal exam   DILATION AND CURETTAGE OF UTERUS     excision chest wall mass     MASTECTOMY Bilateral 2011,2012   UPPER GI ENDOSCOPY     Social History   Socioeconomic History   Marital status: Married    Spouse name: Ronalee Belts   Number of children: 2   Years  of education: some college   Highest education level: Some college, no degree  Occupational History    Employer: [employer]  Tobacco Use   Smoking status: Former    Years: 0.00    Types: Cigarettes   Smokeless tobacco: Never   Tobacco comments:    was previously social smoker  Scientific laboratory technician Use: Never used  Substance and Sexual Activity   Alcohol use: Yes    Comment: occassionally   Drug use: Never   Sexual activity: Not on file  Other Topics Concern   Not on file  Social History Narrative   Not on file   Social Determinants of Health   Financial Resource Strain: Not on file  Food Insecurity: Not on file  Transportation Needs: Not on file  Physical Activity: Not on file  Stress: Not on file  Social Connections: Not on file  Intimate Partner Violence: Not on file   Social History   Social History Narrative   Not on file     ROS: Negative.     PE: HEENT: Negative. Lungs: Clear. Cardio: RR.   Assessment/Plan:  Proceed with planned endoscopy.  Forest Gleason Aracelis Ulrey 03/23/2021

## 2021-03-23 NOTE — Anesthesia Postprocedure Evaluation (Addendum)
Anesthesia Post Note  Patient: Donna Hoffman  Procedure(s) Performed: COLONOSCOPY WITH PROPOFOL  Patient location during evaluation: Endoscopy Anesthesia Type: MAC Level of consciousness: awake and alert Pain management: pain level controlled Vital Signs Assessment: post-procedure vital signs reviewed and stable Respiratory status: spontaneous breathing, nonlabored ventilation, respiratory function stable and patient connected to nasal cannula oxygen Cardiovascular status: stable and blood pressure returned to baseline Postop Assessment: no apparent nausea or vomiting Anesthetic complications: no   No notable events documented.   Last Vitals:  Vitals:   03/23/21 0848 03/23/21 0858  BP: (!) 101/59 103/64  Pulse: 77 71  Resp: 18 15  Temp:    SpO2: 99% 100%    Last Pain:  Vitals:   03/23/21 0858  TempSrc:   PainSc: 0-No pain                 Tonny Bollman

## 2021-03-24 ENCOUNTER — Encounter: Payer: Self-pay | Admitting: General Surgery

## 2021-03-24 LAB — SURGICAL PATHOLOGY

## 2021-03-27 ENCOUNTER — Other Ambulatory Visit: Payer: Self-pay

## 2021-03-27 ENCOUNTER — Ambulatory Visit (INDEPENDENT_AMBULATORY_CARE_PROVIDER_SITE_OTHER): Payer: Commercial Managed Care - PPO | Admitting: Family Medicine

## 2021-03-27 DIAGNOSIS — Z23 Encounter for immunization: Secondary | ICD-10-CM | POA: Diagnosis not present

## 2021-03-27 NOTE — Progress Notes (Signed)
0

## 2021-07-03 ENCOUNTER — Ambulatory Visit (INDEPENDENT_AMBULATORY_CARE_PROVIDER_SITE_OTHER): Payer: Commercial Managed Care - PPO

## 2021-07-03 ENCOUNTER — Other Ambulatory Visit: Payer: Self-pay

## 2021-07-03 DIAGNOSIS — Z23 Encounter for immunization: Secondary | ICD-10-CM

## 2021-09-29 ENCOUNTER — Other Ambulatory Visit: Payer: Self-pay | Admitting: Family Medicine

## 2021-09-30 NOTE — Telephone Encounter (Signed)
Requested medication (s) are due for refill today: yes ? ?Requested medication (s) are on the active medication list: yes   ? ?Last refill: 07/15/20 127.5  5 refills ? ?Future visit scheduled yes 02/26/22 ? ?Notes to clinic: Failed due to Mammogram due, please review. Thank you ? ?Requested Prescriptions  ?Pending Prescriptions Disp Refills  ? estradiol (ESTRACE) 0.1 MG/GM vaginal cream [Pharmacy Med Name: ESTRADIOL 0.01% CREAM] 127.5 g 5  ?  Sig: PLACE 1 APPLICATORFUL VAGINALLY 3 (THREE) TIMES A WEEK. AT BEDTIME  ?  ? OB/GYN:  Estrogens Failed - 09/29/2021  8:53 PM  ?  ?  Failed - Mammogram is up-to-date per Health Maintenance  ?  ?  Passed - Last BP in normal range  ?  BP Readings from Last 1 Encounters:  ?03/23/21 103/64  ?  ?  ?  ?  Passed - Valid encounter within last 12 months  ?  Recent Outpatient Visits   ? ?      ? 7 months ago Annual physical exam  ? [facility], Dionne Bucy, MD  ? 1 year ago Palpitation  ? [facility], Dionne Bucy, MD  ? 1 year ago Annual physical exam  ? [facility], Dionne Bucy, MD  ? 2 years ago Palpitation  ? [facility], Dionne Bucy, MD  ? 2 years ago Encounter for annual physical exam  ? [facility], Dionne Bucy, MD  ? ?  ?  ?Future Appointments   ? ?        ? In 4 months [place], Dionne Bucy, MD [facility]  ? ?  ? ? ?  ?  ?  ? ? ? ? ?

## 2022-02-26 ENCOUNTER — Encounter: Payer: Commercial Managed Care - PPO | Admitting: Family Medicine

## 2022-02-26 NOTE — Progress Notes (Unsigned)
Complete physical exam  I,Joseline E Rosas,acting as a scribe for [employer], MD.,have documented all relevant documentation on the behalf of Eulis Foster, MD,as directed by  Eulis Foster, MD while in the presence of Eulis Foster, MD.   Patient: Donna Hoffman   DOB: 1960-09-15   61 y.o. Female  MRN: [id number] Visit Date: 03/01/2022  Today's healthcare provider: Eulis Foster, MD   Chief Complaint  Patient presents with   Annual Exam   Subjective    Donna Hoffman is a 61 y.o. female who presents today for a complete physical exam.  She reports consuming a well balanced diet. Home exercise routine includes walking and some cardio. She generally feels well. She reports sleeping well. She does not have additional problems to discuss today.   Memory Issues  Patient reports that she has noticed that her memory is not the same as it used to be  She states that she had a test at her last physical and can still remember the three items from the recall portion of the test  She denies forgetting to turn off the stove or that she has problems remembering how to get to places  She reports that she is still able to function well at work but is not able to remember chart numbers as easily as she used to  She denies needing to leave reminder notes around her home    Vitamin D Deficiency  Patient continues to report adherence to vitamin d supplementation  She denies any current issues with this problem  She is agreeable to have her levels checked today   HLD  Patient is no on current medication for cholesterol  She is working to maintain a balanced diet to control her levels  She is agreeable to a lipid panel today     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  Plan adjuvant chemotherapy.   GERD  (gastroesophageal reflux disease)    Heart murmur    Malignant neoplasm of breast (female), unspecified site 05/11/2010   Invasive ductal carcinoma, extensive DCIS. BRCA negative/ right breast   Primary cancer of upper outer quadrant of right female breast ([place]) 06/05/2015   Past Surgical History:  Procedure Laterality Date   ABDOMINAL HYSTERECTOMY  05/04/2013   total hysterectomy   BREAST SURGERY Left 05/27/2011   Left simple mastectomy for benign disease.   BREAST SURGERY Right 05/28/2010   Right simple mastectomy/sentinel node biopsy/breast ca   COLONOSCOPY  2012   COLONOSCOPY  03/09/2011   Normal exam   COLONOSCOPY WITH PROPOFOL N/A 03/23/2021   Procedure: COLONOSCOPY WITH PROPOFOL;  Surgeon: Robert Bellow, MD;  Location: [facility] ENDOSCOPY;  Service: Endoscopy;  Laterality: N/A;   DILATION AND CURETTAGE OF UTERUS     excision chest wall mass     MASTECTOMY Bilateral 2011,2012   UPPER GI ENDOSCOPY     Social History   Socioeconomic History   Marital status: Married    Spouse name: Ronalee Belts   Number of children: 2   Years of education: some college   Highest education level: Some college, no degree  Occupational History    Employer: [employer]  Tobacco Use   Smoking status: Former    Years: 0.00    Types: Cigarettes   Smokeless tobacco: Never   Tobacco comments:    was previously  social smoker  Vaping Use   Vaping Use: Never used  Substance and Sexual Activity   Alcohol use: Yes    Comment: occassionally   Drug use: Never   Sexual activity: Not on file  Other Topics Concern   Not on file  Social History Narrative   Not on file   Social Determinants of Health   Financial Resource Strain: Low Risk  (11/28/2017)   Overall Financial Resource Strain (CARDIA)    Difficulty of Paying Living Expenses: Not hard at all  Food Insecurity: No Food Insecurity (11/28/2017)   Hunger Vital Sign    Worried About Running Out of Food in the Last Year: Never true    Ran Out  of Food in the Last Year: Never true  Transportation Needs: No Transportation Needs (11/28/2017)   PRAPARE - Administrator, Civil Service (Medical): No    Lack of Transportation (Non-Medical): No  Physical Activity: Insufficiently Active (11/28/2017)   Exercise Vital Sign    Days of Exercise per Week: 3 days    Minutes of Exercise per Session: 20 min  Stress: Not on file  Social Connections: Not on file  Intimate Partner Violence: Not on file   Family Status  Relation Name Status   Mother  Alive   Father  Alive   Sister  Alive   Brother  Alive   Sister  Alive   Sister  Alive   PGF  (Not Specified)   Family History  Problem Relation Age of Onset   Breast cancer Mother        x2   Arthritis Mother    Cancer Mother        breast   Colon polyps Father    Diabetes Father    Hypertension Father    Cancer Father        skin   Kidney disease Father    Arthritis Sister    Healthy Brother    Healthy Sister    Healthy Sister    Colon cancer Paternal Grandfather    Allergies  Allergen Reactions   Iodinated Contrast Media Swelling    Eye swelling   Epinephrine    Neomycin Hives   Other Other (See Comments)    polymycin - blisters   Penicillins Itching    Patient Care Team: Erasmo Downer, MD as PCP - General (Family Medicine) Earline Mayotte, MD (General Surgery)   Medications: Outpatient Medications Prior to Visit  Medication Sig   cholecalciferol (VITAMIN D) 1000 UNITS tablet Take 1,000 Units by mouth daily.   estradiol (ESTRACE) 0.1 MG/GM vaginal cream PLACE 1 APPLICATORFUL VAGINALLY 3 (THREE) TIMES A WEEK. AT BEDTIME   famotidine (PEPCID) 20 MG tablet Take 1 tablet (20 mg total) by mouth daily as needed for heartburn or indigestion.   No facility-administered medications prior to visit.    Review of Systems  All other systems reviewed and are negative.     Objective     BP 117/75 (BP Location: Left Arm, Patient Position: Sitting,  Cuff Size: Normal)   Pulse 75   Temp 98 F (36.7 C) (Oral)   Resp 16   Ht 5\' 5"  (1.651 m)   Wt 154 lb 8 oz (70.1 kg)   LMP 12/05/2012   BMI 25.71 kg/m     Physical Exam Constitutional:      General: She is not in acute distress.    Appearance: Normal appearance. She is not ill-appearing, toxic-appearing or diaphoretic.  HENT:     Right Ear: External ear normal.     Left Ear: External ear normal.     Nose: Nose normal.     Mouth/Throat:     Pharynx: Oropharynx is clear.  Eyes:     General: No scleral icterus.    Conjunctiva/sclera: Conjunctivae normal.     Pupils: Pupils are equal, round, and reactive to light.  Cardiovascular:     Rate and Rhythm: Normal rate and regular rhythm.     Pulses: Normal pulses.     Heart sounds: No murmur heard.    No friction rub. No gallop.  Pulmonary:     Effort: Pulmonary effort is normal. No respiratory distress.     Breath sounds: Normal breath sounds. No wheezing, rhonchi or rales.  Abdominal:     General: Bowel sounds are normal. There is no distension.     Palpations: Abdomen is soft. There is no mass.     Tenderness: There is no abdominal tenderness. There is no guarding.  Musculoskeletal:        General: No deformity.     Cervical back: Normal range of motion and neck supple. No rigidity.     Right lower leg: No edema.     Left lower leg: No edema.  Lymphadenopathy:     Cervical: No cervical adenopathy.  Skin:    General: Skin is warm.     Capillary Refill: Capillary refill takes less than 2 seconds.     Findings: No erythema or rash.  Neurological:     General: No focal deficit present.     Mental Status: She is alert and oriented to person, place, and time.     Gait: Gait normal.  Psychiatric:        Mood and Affect: Mood normal.        Behavior: Behavior normal.       Last depression screening scores    03/01/2022    8:28 AM 02/13/2021    9:11 AM 09/16/2020    8:56 AM  PHQ 2/9 Scores  PHQ - 2 Score 0 0 0  PHQ- 9  Score  0 2   Last fall risk screening    02/13/2021    9:11 AM  [place] in the past year? 0  Number falls in past yr: 0  Injury with Fall? 0  Risk for fall due to : No Fall Risks  Follow up Falls evaluation completed   Last Audit-C alcohol use screening    02/13/2021    9:10 AM  Alcohol Use Disorder Test (AUDIT)  1. How often do you have a drink containing alcohol? 0  2. How many drinks containing alcohol do you have on a typical day when you are drinking? 0  3. How often do you have six or more drinks on one occasion? 0  AUDIT-C Score 0   A score of 3 or more in women, and 4 or more in men indicates increased risk for alcohol abuse, EXCEPT if all of the points are from question 1   No results found for any visits on 03/01/22.  Assessment & Plan    Routine Health Maintenance and Physical Exam  Exercise Activities and Dietary recommendations  Goals      Exercise 150 minutes per week (moderate activity)        Immunization History  Administered Date(s) Administered   Influenza,inj,Quad PF,6+ Mos 02/13/2021, 03/01/2022   Moderna Sars-Covid-2 Vaccination 04/18/2020   PFIZER(Purple  Top)SARS-COV-2 Vaccination 06/18/2019, 07/14/2019   Td 03/11/1997   Tdap 02/21/2007, 01/21/2017   Zoster Recombinat (Shingrix) 03/27/2021, 07/03/2021    Health Maintenance  Topic Date Due   COVID-19 Vaccine (4 - Pfizer series) 06/13/2020   TETANUS/TDAP  01/22/2027   COLONOSCOPY (Pts 45-78yrs Insurance coverage will need to be confirmed)  03/24/2031   INFLUENZA VACCINE  Completed   Hepatitis C Screening  Completed   HIV Screening  Completed   Zoster Vaccines- Shingrix  Completed   HPV VACCINES  Aged Out    Discussed health benefits of physical activity, and encouraged her to engage in regular exercise appropriate for her age and condition.  Problem List Items Addressed This Visit       Other   Hypercholesterolemia    Chronic, diet controlled  No current medicatios Will  check lipid panel today       Relevant Orders   Lipid panel   Avitaminosis D    Chronic, stable  Will check vitamin D levels today  Continue vitamin D 1000 units daily       Relevant Orders   Vitamin D (25 hydroxy)   Elevated liver enzymes    Will check CMP today       Relevant Orders   Comprehensive metabolic panel   Encounter for annual physical examination excluding gynecological examination in a patient older than 17 years - Primary    Up to date on colonoscopy, last completed in 2022 S/p total hysterectomy, no longer needs pap smears  Annual influenza vaccine given today  Recommended for COVID vaccine        Relevant Orders   Comprehensive metabolic panel   Lipid panel   Vitamin D (25 hydroxy)   Memory changes    Chronic, stable  Recommended she continue with word games  No medications recommendations at this time  Suggested patient monitor for changes including difficulty remembering names or directions for driving to familiar locations or for any change in functioning at work  Previously evaluated for this problem with no abnormalities noted          Return in about 1 year (around 03/02/2023) for physical.     I, Eulis Foster, MD, have reviewed all documentation for this visit. The documentation on 03/01/22 for the exam, diagnosis, procedures, and orders are all accurate and complete.    Eulis Foster, MD  [facility] [phone] (phone) [phone] (fax)  [place]

## 2022-03-01 ENCOUNTER — Encounter: Payer: Self-pay | Admitting: Family Medicine

## 2022-03-01 ENCOUNTER — Ambulatory Visit (INDEPENDENT_AMBULATORY_CARE_PROVIDER_SITE_OTHER): Payer: Commercial Managed Care - PPO | Admitting: Family Medicine

## 2022-03-01 VITALS — BP 117/75 | HR 75 | Temp 98.0°F | Resp 16 | Ht 65.0 in | Wt 154.5 lb

## 2022-03-01 DIAGNOSIS — E559 Vitamin D deficiency, unspecified: Secondary | ICD-10-CM | POA: Diagnosis not present

## 2022-03-01 DIAGNOSIS — Z23 Encounter for immunization: Secondary | ICD-10-CM | POA: Diagnosis not present

## 2022-03-01 DIAGNOSIS — R748 Abnormal levels of other serum enzymes: Secondary | ICD-10-CM

## 2022-03-01 DIAGNOSIS — E7800 Pure hypercholesterolemia, unspecified: Secondary | ICD-10-CM

## 2022-03-01 DIAGNOSIS — Z0000 Encounter for general adult medical examination without abnormal findings: Secondary | ICD-10-CM | POA: Insufficient documentation

## 2022-03-01 DIAGNOSIS — R413 Other amnesia: Secondary | ICD-10-CM

## 2022-03-01 NOTE — Assessment & Plan Note (Signed)
Will check CMP today

## 2022-03-01 NOTE — Assessment & Plan Note (Addendum)
Chronic, stable  Recommended she continue with word games  No medications recommendations at this time  Suggested patient monitor for changes including difficulty remembering names or directions for driving to familiar locations or for any change in functioning at work  Previously evaluated for this problem with no abnormalities noted

## 2022-03-01 NOTE — Assessment & Plan Note (Signed)
Chronic, stable  Will check vitamin D levels today  Continue vitamin D 1000 units daily

## 2022-03-01 NOTE — Assessment & Plan Note (Addendum)
Up to date on colonoscopy, last completed in 2022 S/p total hysterectomy, no longer needs pap smears  Annual influenza vaccine given today  Recommended for COVID vaccine

## 2022-03-01 NOTE — Assessment & Plan Note (Signed)
Chronic, diet controlled  No current medicatios Will check lipid panel today

## 2022-03-02 LAB — LIPID PANEL
Chol/HDL Ratio: 4 ratio (ref 0.0–4.4)
Cholesterol, Total: 280 mg/dL — ABNORMAL HIGH (ref 100–199)
HDL: 70 mg/dL (ref >39)
LDL Chol Calc (NIH): 191 mg/dL — ABNORMAL HIGH (ref 0–99)
Triglycerides: 108 mg/dL (ref 0–149)
VLDL Cholesterol Cal: 19 mg/dL (ref 5–40)

## 2022-03-02 LAB — COMPREHENSIVE METABOLIC PANEL
ALT: 10 IU/L (ref 0–32)
AST: 16 IU/L (ref 0–40)
Albumin/Globulin Ratio: 1.8 (ref 1.2–2.2)
Albumin: 4.5 g/dL (ref 3.9–4.9)
Alkaline Phosphatase: 112 IU/L (ref 44–121)
BUN/Creatinine Ratio: 19 (ref 12–28)
BUN: 13 mg/dL (ref 8–27)
Bilirubin Total: 0.5 mg/dL (ref 0.0–1.2)
CO2: 23 mmol/L (ref 20–29)
Calcium: 9.5 mg/dL (ref 8.7–10.3)
Chloride: 104 mmol/L (ref 96–106)
Creatinine, Ser: 0.7 mg/dL (ref 0.57–1.00)
Globulin, Total: 2.5 g/dL (ref 1.5–4.5)
Glucose: 95 mg/dL (ref 70–99)
Potassium: 4.5 mmol/L (ref 3.5–5.2)
Sodium: 144 mmol/L (ref 134–144)
Total Protein: 7 g/dL (ref 6.0–8.5)
eGFR: 98 mL/min/{1.73_m2} (ref >59)

## 2022-03-02 LAB — VITAMIN D 25 HYDROXY (VIT D DEFICIENCY, FRACTURES): Vit D, 25-Hydroxy: 23.1 ng/mL — ABNORMAL LOW (ref 30.0–100.0)

## 2023-03-10 ENCOUNTER — Ambulatory Visit: Payer: Self-pay

## 2023-03-10 ENCOUNTER — Encounter: Payer: Self-pay | Admitting: Emergency Medicine

## 2023-03-10 ENCOUNTER — Ambulatory Visit
Admission: EM | Admit: 2023-03-10 | Discharge: 2023-03-10 | Disposition: A | Discharge status: Home / Self Care | Payer: Commercial Managed Care - PPO | Attending: Emergency Medicine | Admitting: Emergency Medicine

## 2023-03-10 DIAGNOSIS — R300 Dysuria: Secondary | ICD-10-CM | POA: Insufficient documentation

## 2023-03-10 LAB — POCT URINALYSIS DIP (MANUAL ENTRY)
Bilirubin, UA: NEGATIVE
Glucose, UA: NEGATIVE mg/dL
Ketones, POC UA: NEGATIVE mg/dL
Nitrite, UA: NEGATIVE
Protein Ur, POC: NEGATIVE mg/dL
Spec Grav, UA: 1.015 (ref 1.010–1.025)
Urobilinogen, UA: 1 E.U./dL
pH, UA: 7 (ref 5.0–8.0)

## 2023-03-10 MED ORDER — CEPHALEXIN 500 MG PO CAPS: 500.0000 mg | ORAL_CAPSULE | Freq: Two times a day (BID) | ORAL | 0 refills | Status: AC

## 2023-03-10 NOTE — ED Triage Notes (Signed)
Patient in office today c/o burning, pain and frequent urination x yesterday.  OTC: none  Denies: Vomiting, fever

## 2023-03-10 NOTE — Telephone Encounter (Signed)
  Chief Complaint: Pain in abdomen Symptoms: pain - spasms - a bit of nausea with pain Frequency: yesterday Pertinent Negatives: Patient denies fever Disposition: [] ED /[x] Urgent Care (no appt availability in office) / [] Appointment(In office/virtual)/ []  [place] [facility]/ [] Home Care/ [] Refused Recommended Disposition /[] [facility]/ []  Follow-up with PCP Additional Notes: Pt stated that she has pain in her urethra that shoots up like a spasm. Comes and goes. Some frequency, but pt is drinking a lot of water. No appts at [facility], [facility], or with Erin mecum at [facility]. Pt will go to UC.    Summary: Question UTI   Abdominal pain, vaginal pain, nausea,  (2 days)     Reason for Disposition  [1] MILD-MODERATE pain AND [2] constant AND [3] present > 2 hours  Answer Assessment - Initial Assessment Questions 1. LOCATION: "Where does it hurt?"      urethra 2. RADIATION: "Does the pain shoot anywhere else?" (e.g., chest, back)     Up middle of abdomen 3. ONSET: "When did the pain begin?" (e.g., minutes, hours or days ago)      yesterday 4. SUDDEN: "Gradual or sudden onset?"     gradula 5. PATTERN "Does the pain come and go, or is it constant?"    - If it comes and goes: "How long does it last?" "Do you have pain now?"     (Note: Comes and goes means the pain is intermittent. It goes away completely between bouts.)    - If constant: "Is it getting better, staying the same, or getting worse?"      (Note: Constant means the pain never goes away completely; most serious pain is constant and gets worse.)      Comes and goes 6. SEVERITY: "How bad is the pain?"  (e.g., Scale 1-10; mild, moderate, or severe)    - MILD (1-3): Doesn't interfere with normal activities, abdomen soft and not tender to touch.     - MODERATE (4-7): Interferes with normal activities or awakens from sleep, abdomen tender to touch.     - SEVERE (8-10): Excruciating pain, doubled over, unable to do any normal  activities.       4/10 7. RECURRENT SYMPTOM: "Have you ever had this type of stomach pain before?" If Yes, ask: "When was the last time?" and "What happened that time?"      yes 8. CAUSE: "What do you think is causing the stomach pain?"     UTI 10. OTHER SYMPTOMS: "Do you have any other symptoms?" (e.g., back pain, diarrhea, fever, urination pain, vomiting)       no  Protocols used: Abdominal Pain - Female-A-AH

## 2023-03-10 NOTE — Discharge Instructions (Addendum)
Take the antibiotic as directed.  The urine culture is pending.  We will call you if it shows the need to change or discontinue your antibiotic.    Follow up with your primary care provider if your symptoms are not improving.

## 2023-03-10 NOTE — ED Provider Notes (Signed)
Renaldo Fiddler    CSN: [id number] Arrival date & time: 03/10/23  1727      History   Chief Complaint Chief Complaint  Patient presents with   Urinary Frequency    HPI Donna Hoffman is a 62 y.o. female.  Patient presents with 1 day history of dysuria, urinary frequency, suprapubic discomfort.  No fever, hematuria, nausea, vomiting, diarrhea, vaginal discharge, pelvic pain, or other symptoms.  No OTC medication.  The history is provided by the patient and medical records.    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  Plan adjuvant chemotherapy.   GERD (gastroesophageal reflux disease)    Heart murmur    Malignant neoplasm of breast (female), unspecified site 05/11/2010   Invasive ductal carcinoma, extensive DCIS. 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) 06/05/2015    Patient Active Problem List   Diagnosis Date Noted   Encounter for annual physical examination excluding gynecological examination in a patient older than 17 years 03/01/2022   Memory changes 03/01/2022   Mitral valve insufficiency 09/25/2020   Palpitations 03/29/2019   Bursitis of hip 01/24/2019   Lumbar radiculopathy 04/17/2018   Vaginal atrophy 11/28/2017   Elevated liver enzymes 09/24/2015   Hypercholesterolemia 06/05/2015   Adenitis 06/05/2015   Avitaminosis D 06/05/2015   History of breast cancer 07/04/2013   Umbilical hernia 12/25/2012   Allergic rhinitis 07/29/2008   Acid reflux 07/04/2008   Mitral valve prolapse 07/04/2008    Past Surgical History:  Procedure Laterality Date   ABDOMINAL HYSTERECTOMY  05/04/2013   total hysterectomy   BREAST SURGERY Left 05/27/2011   Left simple mastectomy for benign disease.   BREAST SURGERY Right 05/28/2010   Right simple mastectomy/sentinel node biopsy/breast ca    COLONOSCOPY  2012   COLONOSCOPY  03/09/2011   Normal exam   COLONOSCOPY WITH PROPOFOL N/A 03/23/2021   Procedure: COLONOSCOPY WITH PROPOFOL;  Surgeon: Earline Mayotte, MD;  Location: [facility] ENDOSCOPY;  Service: Endoscopy;  Laterality: N/A;   DILATION AND CURETTAGE OF UTERUS     excision chest wall mass     MASTECTOMY Bilateral 2011,2012   UPPER GI ENDOSCOPY      OB History     Gravida  2   Para  2   Term      Preterm      AB      Living  2      SAB      IAB      Ectopic      Multiple      Live Births           Obstetric Comments  1st Menstrual Cycle:  12 1st Pregnancy:  28          Home Medications    Prior to Admission medications   Medication Sig Start Date End Date Taking? Authorizing Provider  cephALEXin (KEFLEX) 500 MG capsule Take 1 capsule (500 mg total) by mouth 2 (two) times daily for 5 days. 03/10/23 03/15/23 Yes Mickie Bail, NP  cholecalciferol (VITAMIN D) 1000 UNITS tablet Take 1,000 Units by mouth daily.    [provider]  estradiol (ESTRACE) 0.1 MG/GM vaginal cream PLACE 1 APPLICATORFUL VAGINALLY 3 (THREE) TIMES A WEEK. AT BEDTIME 10/02/21   Erasmo Downer, MD  famotidine (PEPCID) 20 MG  tablet Take 1 tablet (20 mg total) by mouth daily as needed for heartburn or indigestion. 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  Gastrointestinal:  Positive for abdominal pain. Negative for diarrhea, nausea and vomiting.  Genitourinary:  Positive for dysuria and frequency. Negative for flank pain, hematuria, pelvic pain and vaginal discharge.     Physical Exam Triage Vital Signs ED Triage Vitals  Encounter Vitals Group     BP      Systolic BP Percentile      Diastolic BP Percentile      Pulse      Resp      Temp      Temp src      SpO2      Weight      Height      Head Circumference      Peak Flow      Pain Score      Pain Loc      Pain Education      Exclude from Growth Chart    No data found.  Updated Vital Signs BP 124/78   Pulse 65   Temp 98.3 F (36.8 C) (Oral)   Resp 16   Ht 5\' 5"  (1.651 m)   Wt 150 lb (68 kg)   LMP 12/05/2012   SpO2 96%   BMI 24.96 kg/m   Visual Acuity Right Eye Distance:   Left Eye Distance:   Bilateral Distance:    Right Eye Near:   Left Eye Near:    Bilateral Near:     Physical Exam Vitals and nursing note reviewed.  Constitutional:      General: She is not in acute distress.    Appearance: She is well-developed.  HENT:     Head: Atraumatic.     Mouth/Throat:     Mouth: Mucous membranes are moist.  Cardiovascular:     Rate and Rhythm: Normal rate and regular rhythm.  Pulmonary:     Effort: Pulmonary effort is normal. No respiratory distress.  Abdominal:     General: Bowel sounds are normal.     Palpations: Abdomen is soft.     Tenderness: There is no abdominal tenderness. There is no right CVA tenderness, left CVA tenderness, guarding or rebound.  Musculoskeletal:     Cervical back: Neck supple.  Skin:    General: Skin is warm and dry.  Neurological:     Mental Status: She is alert.   Psychiatric:        Mood and Affect: Mood normal.        Behavior: Behavior normal.      UC Treatments / Results  Labs (all labs ordered are listed, but only abnormal results are displayed) Labs Reviewed  POCT URINALYSIS DIP (MANUAL ENTRY) - Abnormal; Notable for the following components:      Result Value   Blood, UA trace-lysed (*)    Leukocytes, UA Small (1+) (*)    All other components within normal limits  URINE CULTURE    EKG   Radiology No results found.  Procedures Procedures (including critical care time)  Medications Ordered in UC Medications - No data to display  Initial Impression / Assessment and Plan / UC Course  I have reviewed the triage vital signs and the nursing notes.  Pertinent labs & imaging results that were available during my care of the patient were reviewed by me and considered in my medical decision making (see chart for details).    Dysuria.  Treating with Keflex. Urine culture pending. Discussed with patient that we will call her if the urine culture shows the need to change or discontinue the antibiotic. Instructed her to follow-up with her PCP if her symptoms are not improving. Patient agrees to plan of care.     Final Clinical Impressions(s) / UC Diagnoses   Final diagnoses:  Dysuria     Discharge Instructions      Take the antibiotic as directed.  The urine culture is pending.  We will call you if it shows the need to change or discontinue your antibiotic.    Follow-up with your primary care provider if your symptoms are not improving.      ED Prescriptions     Medication Sig Dispense Auth. Provider   cephALEXin (KEFLEX) 500 MG capsule Take 1 capsule (500 mg total) by mouth 2 (two) times daily for 5 days. 10 capsule Mickie Bail, NP      PDMP not reviewed this encounter.   Mickie Bail, NP 03/10/23 [phone]

## 2023-03-11 ENCOUNTER — Encounter: Payer: Commercial Managed Care - PPO | Admitting: Family Medicine

## 2023-03-12 LAB — URINE CULTURE: Culture: NO GROWTH

## 2023-03-15 ENCOUNTER — Encounter: Payer: Self-pay | Admitting: Family Medicine

## 2023-03-15 DIAGNOSIS — Z9013 Acquired absence of bilateral breasts and nipples: Secondary | ICD-10-CM | POA: Insufficient documentation

## 2023-03-18 ENCOUNTER — Encounter: Payer: Self-pay | Admitting: Family Medicine

## 2023-03-18 ENCOUNTER — Ambulatory Visit (INDEPENDENT_AMBULATORY_CARE_PROVIDER_SITE_OTHER): Payer: Commercial Managed Care - PPO | Admitting: Family Medicine

## 2023-03-18 VITALS — BP 114/58 | HR 76 | Temp 98.2°F | Ht 65.0 in | Wt 151.0 lb

## 2023-03-18 DIAGNOSIS — Z0000 Encounter for general adult medical examination without abnormal findings: Secondary | ICD-10-CM

## 2023-03-18 DIAGNOSIS — E559 Vitamin D deficiency, unspecified: Secondary | ICD-10-CM | POA: Diagnosis not present

## 2023-03-18 DIAGNOSIS — Z0001 Encounter for general adult medical examination with abnormal findings: Secondary | ICD-10-CM | POA: Diagnosis not present

## 2023-03-18 DIAGNOSIS — Z23 Encounter for immunization: Secondary | ICD-10-CM

## 2023-03-18 DIAGNOSIS — E7800 Pure hypercholesterolemia, unspecified: Secondary | ICD-10-CM | POA: Diagnosis not present

## 2023-03-18 DIAGNOSIS — R3129 Other microscopic hematuria: Secondary | ICD-10-CM

## 2023-03-18 DIAGNOSIS — Z780 Asymptomatic menopausal state: Secondary | ICD-10-CM

## 2023-03-18 MED ORDER — ESTRADIOL 0.1 MG/GM VA CREA: 1.0000 | TOPICAL_CREAM | VAGINAL | 5 refills | Status: DC

## 2023-03-18 NOTE — Assessment & Plan Note (Signed)
Not currently on a statin. -Check lipid panel today.

## 2023-03-18 NOTE — Assessment & Plan Note (Signed)
Previous low levels. -Check vitamin D levels today.

## 2023-03-18 NOTE — Progress Notes (Signed)
Complete physical exam  Patient: Donna Hoffman   DOB: [birth year]   62 y.o. Female  MRN: [id number]  Subjective:    Chief Complaint  Patient presents with   Annual Exam    Teneisha Gignac Konieczny is a 62 y.o. female who presents today for a complete physical exam. She reports consuming a general diet.  She generally feels well. She reports sleeping well. She does not have additional problems to discuss today.   Discussed the use of AI scribe software for clinical note transcription with the patient, who gave verbal consent to proceed.  History of Present Illness   The patient, a 62 year old female with a history of hyperlipidemia, GERD, and low vitamin D levels, presents for her annual physical. She is not currently on a statin and takes Pepcid 20mg  daily as needed for GERD. She has a history of bilateral mastectomy and hysterectomy, and therefore does not require mammogram or cervical cancer screening. She has a family history of osteoporosis and requests a bone density test, never having had one before.  Last week, the patient visited urgent care for a suspected UTI and was started on antibiotics. Although her culture came back negative, her symptoms improved after three days on the antibiotic. She reports having had blood in her urine and wants to follow up on this, as she has a family history of bladder cancer.       Most recent fall risk assessment:    03/18/2023    8:37 AM  Fall Risk   Falls in the past year? 0  Number falls in past yr: 0  Injury with Fall? 0     Most recent depression screenings:    03/18/2023    8:37 AM 03/01/2022    8:28 AM  PHQ 2/9 Scores  PHQ - 2 Score 0 0  PHQ- 9 Score 1         Patient Care Team: Erasmo Downer, MD as PCP - General (Family Medicine) Lemar Livings Merrily Pew, MD (General Surgery)   Outpatient Medications Prior to Visit  Medication Sig   cholecalciferol (VITAMIN D) 1000 UNITS tablet Take 1,000 Units by mouth daily.   famotidine  (PEPCID) 20 MG tablet Take 1 tablet (20 mg total) by mouth daily as needed for heartburn or indigestion.   [DISCONTINUED] estradiol (ESTRACE) 0.1 MG/GM vaginal cream PLACE 1 APPLICATORFUL VAGINALLY 3 (THREE) TIMES A WEEK. AT BEDTIME   No facility-administered medications prior to visit.    ROS per HPI        Objective:     BP (!) 114/58 (BP Location: Left Arm, Patient Position: Sitting, Cuff Size: Normal)   Pulse 76   Temp 98.2 F (36.8 C) (Oral)   Ht 5\' 5"  (1.651 m)   Wt 151 lb (68.5 kg)   LMP 12/05/2012   SpO2 98%   BMI 25.13 kg/m    Physical Exam Vitals reviewed.  Constitutional:      General: She is not in acute distress.    Appearance: Normal appearance. She is well-developed. She is not diaphoretic.  HENT:     Head: Normocephalic and atraumatic.     Right Ear: Tympanic membrane, ear canal and external ear normal.     Left Ear: Tympanic membrane, ear canal and external ear normal.     Nose: Nose normal.     Mouth/Throat:     Mouth: Mucous membranes are moist.     Pharynx: Oropharynx is clear. No oropharyngeal exudate.  Eyes:  General: No scleral icterus.    Conjunctiva/sclera: Conjunctivae normal.     Pupils: Pupils are equal, round, and reactive to light.  Neck:     Thyroid: No thyromegaly.  Cardiovascular:     Rate and Rhythm: Normal rate and regular rhythm.     Heart sounds: Normal heart sounds. No murmur heard. Pulmonary:     Effort: Pulmonary effort is normal. No respiratory distress.     Breath sounds: Normal breath sounds. No wheezing or rales.  Abdominal:     General: There is no distension.     Palpations: Abdomen is soft.     Tenderness: There is no abdominal tenderness.  Musculoskeletal:        General: No deformity.     Cervical back: Neck supple.     Right lower leg: No edema.     Left lower leg: No edema.  Lymphadenopathy:     Cervical: No cervical adenopathy.  Skin:    General: Skin is warm and dry.     Findings: No rash.   Neurological:     Mental Status: She is alert and oriented to person, place, and time. Mental status is at baseline.     Gait: Gait normal.  Psychiatric:        Mood and Affect: Mood normal.        Behavior: Behavior normal.        Thought Content: Thought content normal.      No results found for any visits on 03/18/23.     Assessment & Plan:    Routine Health Maintenance and Physical Exam  Immunization History  Administered Date(s) Administered   Influenza, Seasonal, Injecte, Preservative Fre 03/18/2023   Influenza,inj,Quad PF,6+ Mos 02/13/2021, 03/01/2022   Moderna Sars-Covid-2 Vaccination 04/18/2020   PFIZER(Purple Top)SARS-COV-2 Vaccination 06/18/2019, 07/14/2019   Td 03/11/1997   Tdap 02/21/2007, 01/21/2017   Zoster Recombinant(Shingrix) 03/27/2021, 07/03/2021    Health Maintenance  Topic Date Due   COVID-19 Vaccine (4 - 2023-24 season) 02/13/2023   DTaP/Tdap/Td (4 - Td or Tdap) 01/22/2027   Colonoscopy  03/24/2031   INFLUENZA VACCINE  Completed   Hepatitis C Screening  Completed   HIV Screening  Completed   Zoster Vaccines- Shingrix  Completed   HPV VACCINES  Aged Out    Discussed health benefits of physical activity, and encouraged her to engage in regular exercise appropriate for her age and condition.  Problem List Items Addressed This Visit       Other   Hypercholesterolemia    Not currently on a statin. -Check lipid panel today.      Relevant Orders   Lipid panel   Comprehensive metabolic panel   Avitaminosis D    Previous low levels. -Check vitamin D levels today.      Relevant Orders   VITAMIN D 25 Hydroxy (Vit-D Deficiency, Fractures)   Other Visit Diagnoses     Encounter for annual physical exam    -  Primary   Relevant Orders   Lipid panel   Comprehensive metabolic panel   VITAMIN D 25 Hydroxy (Vit-D Deficiency, Fractures)   Need for influenza vaccination       Postmenopausal       Relevant Orders   DG Bone Density   Other  microscopic hematuria       Relevant Orders   Urinalysis, Routine w reflex microscopic   Encounter for immunization       Relevant Orders   Flu vaccine trivalent PF, 6mos and older(Flulaval,Afluria,Fluarix,Fluzone) (  Completed)           Osteoporosis Screening Family history of osteoporosis. Patient has not had a bone density test before. -Order bone density test at [facility].  Hematuria Recent episode of hematuria and symptoms of UTI, which improved with antibiotics despite negative culture. -Order urine test to be done in 6 weeks (early November) to recheck for hematuria.  GERD Managed with Pepcid 20mg  daily as needed. -Continue current management.  General Health Maintenance -Administered flu shot today. -Informed patient about availability of COVID booster shot. -Refill estradiol cream prescription. -Schedule next year's physical at checkout. -Continue regular skin checks with Dr. Isa Rankin. -Plan for pneumonia vaccination at age 4. -Next colonoscopy due in 2032. -Next tetanus shot due in 2028.       Return in about 1 year (around 03/17/2024) for CPE.     Shirlee Latch, MD

## 2023-03-19 LAB — COMPREHENSIVE METABOLIC PANEL
ALT: 13 [IU]/L (ref 0–32)
AST: 17 [IU]/L (ref 0–40)
Albumin: 4.3 g/dL (ref 3.9–4.9)
Alkaline Phosphatase: 127 [IU]/L — ABNORMAL HIGH (ref 44–121)
BUN/Creatinine Ratio: 22 (ref 12–28)
BUN: 17 mg/dL (ref 8–27)
Bilirubin Total: 0.5 mg/dL (ref 0.0–1.2)
CO2: 27 mmol/L (ref 20–29)
Calcium: 9.4 mg/dL (ref 8.7–10.3)
Chloride: 104 mmol/L (ref 96–106)
Creatinine, Ser: 0.77 mg/dL (ref 0.57–1.00)
Globulin, Total: 2.2 g/dL (ref 1.5–4.5)
Glucose: 87 mg/dL (ref 70–99)
Potassium: 4.6 mmol/L (ref 3.5–5.2)
Sodium: 143 mmol/L (ref 134–144)
Total Protein: 6.5 g/dL (ref 6.0–8.5)
eGFR: 87 mL/min/{1.73_m2} (ref >59)

## 2023-03-19 LAB — LIPID PANEL
Chol/HDL Ratio: 3.8 {ratio} (ref 0.0–4.4)
Cholesterol, Total: 233 mg/dL — ABNORMAL HIGH (ref 100–199)
HDL: 61 mg/dL (ref >39)
LDL Chol Calc (NIH): 156 mg/dL — ABNORMAL HIGH (ref 0–99)
Triglycerides: 93 mg/dL (ref 0–149)
VLDL Cholesterol Cal: 16 mg/dL (ref 5–40)

## 2023-03-19 LAB — VITAMIN D 25 HYDROXY (VIT D DEFICIENCY, FRACTURES): Vit D, 25-Hydroxy: 50.6 ng/mL (ref 30.0–100.0)

## 2023-04-16 LAB — URINALYSIS, ROUTINE W REFLEX MICROSCOPIC
Bilirubin, UA: NEGATIVE
Glucose, UA: NEGATIVE
Ketones, UA: NEGATIVE
Leukocytes,UA: NEGATIVE
Nitrite, UA: NEGATIVE
Protein,UA: NEGATIVE
RBC, UA: NEGATIVE
Specific Gravity, UA: 1.017 (ref 1.005–1.030)
Urobilinogen, Ur: 1 mg/dL (ref 0.2–1.0)
pH, UA: 7 (ref 5.0–7.5)

## 2023-04-25 ENCOUNTER — Ambulatory Visit
Admission: RE | Admit: 2023-04-25 | Discharge: 2023-04-25 | Disposition: A | Discharge status: Home / Self Care | Payer: Commercial Managed Care - PPO | Source: Ambulatory Visit | Attending: Family Medicine | Admitting: Family Medicine

## 2023-04-25 DIAGNOSIS — Z780 Asymptomatic menopausal state: Secondary | ICD-10-CM | POA: Diagnosis present

## 2023-04-25 DIAGNOSIS — M85851 Other specified disorders of bone density and structure, right thigh: Secondary | ICD-10-CM | POA: Insufficient documentation

## 2023-10-28 ENCOUNTER — Encounter: Payer: Self-pay | Admitting: Family Medicine

## 2023-10-28 DIAGNOSIS — Z780 Asymptomatic menopausal state: Secondary | ICD-10-CM

## 2023-10-28 NOTE — Telephone Encounter (Signed)
 Ok to refer to GYN

## 2023-12-19 NOTE — Progress Notes (Deleted)
    GYNECOLOGY PROGRESS NOTE  Subjective:  PCP: Myrla Jon HERO, MD  Patient ID: Donna Hoffman, female    DOB: March 15, 1961, 63 y.o.   MRN: [id number]  HPI  Patient is a 63 y.o. G2P2 female who presents for referral from her pcp Jon Myrla MD. For Postmenopausal issues   {Common ambulatory SmartLinks:19316}  Review of Systems {ros; complete:30496}   Objective:   Last menstrual period 12/05/2012. There is no height or weight on file to calculate BMI.  General appearance: {general exam:16600} Abdomen: {abdominal exam:16834} Pelvic: {pelvic exam:16852::cervix normal in appearance,external genitalia normal,no adnexal masses or tenderness,no cervical motion tenderness,rectovaginal septum normal,uterus normal size, shape, and consistency,vagina normal without discharge} Extremities: {extremity exam:5109} Neurologic: {neuro exam:17854}   Assessment/Plan:   No diagnosis found.   There are no diagnoses linked to this encounter.     Estil Mangle, DO [facility]

## 2023-12-20 ENCOUNTER — Encounter: Admitting: Obstetrics

## 2023-12-26 ENCOUNTER — Encounter: Payer: Self-pay | Admitting: Obstetrics

## 2023-12-26 NOTE — Progress Notes (Unsigned)
 GYNECOLOGY PROGRESS NOTE: NEW PATIENT  Subjective:  PCP: Myrla Jon HERO, MD  Patient ID: Donna Hoffman, female    DOB: 04-Oct-1960, 63 y.o.   MRN: [id number]  HPI  Patient is a 63 y.o. G2P2 female who presents for referral from her PCP Jon Myrla MD to discuss painful intercourse with postcoital bleeding for 3 months. She correlates this with being out of her Estrace  from mid-Jan - Apr '25 due to a pharmacy error. PCP manages this Rx. PMH of breast cancer in 2011 s/p bilat mastectomy without reconstruction and sees oncologist yearly for breast exams, and s/p bilateral mastectomy and total hysterectomy in 2014 w/Dr. Arloa for AUB, pathology benign.   Questions about osteopenia, Dexa done 04/2023 with PCP. Not taking anything for this. Also reports mild hot flashes, more warming and not as bad as after her total hyst in 2014. Wondering if she would be candidate for HRT with her breast cancer hx, has read about the benefits of HRT.   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  Plan adjuvant chemotherapy.   GERD (gastroesophageal reflux disease)    Heart murmur    Malignant neoplasm of breast (female), unspecified site 05/11/2010   Invasive ductal carcinoma, extensive DCIS. 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) 06/05/2015   Patient Active Problem List   Diagnosis Date Noted   Osteopenia after menopause 12/27/2023   S/P total  hysterectomy 12/27/2023   Postcoital bleeding 12/27/2023   Hot flashes, menopausal 12/27/2023   H/O bilateral mastectomy 03/15/2023   Memory changes 03/01/2022   Mitral valve insufficiency 09/25/2020   Palpitations 03/29/2019   Bursitis of hip 01/24/2019   Lumbar radiculopathy 04/17/2018   Vaginal atrophy 11/28/2017   Elevated liver enzymes 09/24/2015   Hypercholesterolemia 06/05/2015   Adenitis 06/05/2015   Avitaminosis D 06/05/2015   History of breast cancer 07/04/2013   Umbilical hernia 12/25/2012   Allergic rhinitis 07/29/2008   Acid reflux 07/04/2008   Mitral valve prolapse 07/04/2008   Past Surgical History:  Procedure Laterality Date   ABDOMINAL HYSTERECTOMY  05/04/2013   total hysterectomy   BREAST SURGERY Left 05/27/2011   Left simple mastectomy for benign disease.   BREAST SURGERY Right 05/28/2010   Right simple mastectomy/sentinel node biopsy/breast ca   COLONOSCOPY  2012   COLONOSCOPY  03/09/2011   Normal exam   COLONOSCOPY WITH PROPOFOL  N/A 03/23/2021   Procedure: COLONOSCOPY WITH PROPOFOL ;  Surgeon: Dessa Reyes ORN, MD;  Location: [facility] ENDOSCOPY;  Service: Endoscopy;  Laterality: N/A;   DILATION AND CURETTAGE OF UTERUS     excision chest wall mass     MASTECTOMY Bilateral 2011,2012   UPPER GI ENDOSCOPY     Family History  Problem  Relation Age of Onset   Breast cancer Mother        x2   Arthritis Mother    Cancer Mother        breast   Colon polyps Father    Diabetes Father    Hypertension Father    Cancer Father        skin   Kidney disease Father    Arthritis Sister    Healthy Brother    Healthy Sister    Healthy Sister    Colon cancer Paternal Grandfather    Social History   Socioeconomic History   Marital status: Married    Spouse name: Garrel   Number of children: 2   Years of education: some college   Highest education level: Some college, no degree  Occupational History    Employer: [employer]  Tobacco Use   Smoking status:  Former    Types: Cigarettes   Smokeless tobacco: Never   Tobacco comments:    was previously social smoker  Vaping Use   Vaping status: Never Used  Substance and Sexual Activity   Alcohol use: Yes    Comment: occassionally   Drug use: Never   Sexual activity: Yes    Birth control/protection: None  Other Topics Concern   Not on file  Social History Narrative   Not on file   Social Drivers of Health   Financial Resource Strain: Low Risk  (11/28/2017)   Overall Financial Resource Strain (CARDIA)    Difficulty of Paying Living Expenses: Not hard at all  Food Insecurity: No Food Insecurity (11/28/2017)   Hunger Vital Sign    Worried About Running Out of Food in the Last Year: Never true    Ran Out of Food in the Last Year: Never true  Transportation Needs: No Transportation Needs (11/28/2017)   PRAPARE - Administrator, Civil Service (Medical): No    Lack of Transportation (Non-Medical): No  Physical Activity: Insufficiently Active (11/28/2017)   Exercise Vital Sign    Days of Exercise per Week: 3 days    Minutes of Exercise per Session: 20 min  Stress: Not on file  Social Connections: Not on file  Intimate Partner Violence: Not on file   Current Outpatient Medications on File Prior to Visit  Medication Sig Dispense Refill   cholecalciferol (VITAMIN D ) 1000 UNITS tablet Take 1,000 Units by mouth daily.     estradiol  (ESTRACE ) 0.1 MG/GM vaginal cream Place 1 Applicatorful vaginally 3 (three) times a week. At bedtime 127.5 g 5   famotidine  (PEPCID ) 20 MG tablet Take 1 tablet (20 mg total) by mouth daily as needed for heartburn or indigestion. 90 tablet 3   Multiple Vitamin (MULTIVITAMIN) tablet Take 1 tablet by mouth daily.     No current facility-administered medications on file prior to visit.   Allergies  Allergen Reactions   Iodinated Contrast Media Swelling    Eye swelling   Epinephrine    Neomycin Hives   Other Other (See Comments)    polymycin - blisters    Penicillins Itching    Review of Systems Pertinent items are noted in HPI.   Objective:   Blood pressure 120/60, pulse 70, height 5' 4 (1.626 m), weight 152 lb 9.6 oz (69.2 kg), last menstrual period 12/05/2012. Body mass index is 26.19 kg/m.  General appearance: alert, cooperative, and no distress Abdomen: soft, non-tender; bowel sounds normal; no masses,  no organomegaly Pelvic: external genitalia normal, no adnexal masses  or tenderness, no bladder tenderness, perianal skin: no external genital warts noted, rectovaginal septum normal, uterus surgically absent, and vagina with moderate atrophic changes.  Extremities: extremities normal, atraumatic, no cyanosis or edema Neurologic: Grossly normal   Assessment/Plan:   1. Postcoital bleeding   2. Vaginal atrophy   3. Osteopenia after menopause   4. S/P total hysterectomy   5. Hot flashes, menopausal    63 y.o. G2P2, PMH of breast cancer in 2011 and s/p total hysterectomy in 2014, referred for post-coital bleeding, likely due to VVA and lack of topical Estrace  for a few months. Is osteopenic on Dexa 04/2023, and has mild hot flashes, non-debilitating.  -We discussed how lack of topical Estrace  led to worsening of VVA. Recommend nightly use for the next 2 weeks, then resume 3 x week dose -- ok to use 1/2 applicator every other day if desires. Her PCP manages this Rx. She may transfer it to us  based on her preference.  -Discussion re: HRT for her vasomotor symptoms, would strongly recommend against use due to hx of breast cancer. Risks outweigh the benefits.  -Osteopenia: recommend Calcium 1500mg  and Vit D 06-1998 IU daily, continuing weight-bearing exercise, and decreasing alcohol intake. She should have a Dexa every other year, is doing this with her primary.  -Recommend a pelvic exam with us  every other year, sooner with any symptoms or concerns.    Estil Mangle, DO [facility]

## 2023-12-27 ENCOUNTER — Encounter: Payer: Self-pay | Admitting: Obstetrics

## 2023-12-27 ENCOUNTER — Ambulatory Visit: Admitting: Obstetrics

## 2023-12-27 VITALS — BP 120/60 | HR 70 | Ht 64.0 in | Wt 152.6 lb

## 2023-12-27 DIAGNOSIS — N951 Menopausal and female climacteric states: Secondary | ICD-10-CM | POA: Insufficient documentation

## 2023-12-27 DIAGNOSIS — N952 Postmenopausal atrophic vaginitis: Secondary | ICD-10-CM

## 2023-12-27 DIAGNOSIS — Z90710 Acquired absence of both cervix and uterus: Secondary | ICD-10-CM

## 2023-12-27 DIAGNOSIS — Z780 Asymptomatic menopausal state: Secondary | ICD-10-CM

## 2023-12-27 DIAGNOSIS — M8580 Other specified disorders of bone density and structure, unspecified site: Secondary | ICD-10-CM | POA: Diagnosis not present

## 2023-12-27 DIAGNOSIS — N930 Postcoital and contact bleeding: Secondary | ICD-10-CM | POA: Diagnosis not present

## 2024-02-15 ENCOUNTER — Encounter: Payer: Self-pay | Admitting: Internal Medicine

## 2024-03-21 ENCOUNTER — Ambulatory Visit: Attending: Internal Medicine | Admitting: Internal Medicine

## 2024-03-21 ENCOUNTER — Encounter: Payer: Self-pay | Admitting: Internal Medicine

## 2024-03-21 VITALS — BP 110/72 | HR 63 | Ht 64.0 in | Wt 154.4 lb

## 2024-03-21 DIAGNOSIS — I341 Nonrheumatic mitral (valve) prolapse: Secondary | ICD-10-CM

## 2024-03-21 DIAGNOSIS — R079 Chest pain, unspecified: Secondary | ICD-10-CM | POA: Diagnosis not present

## 2024-03-21 MED ORDER — ASPIRIN 81 MG PO TBEC: 81.0000 mg | DELAYED_RELEASE_TABLET | Freq: Every day | ORAL | Status: DC

## 2024-03-21 NOTE — Patient Instructions (Signed)
 Medication Instructions:  Your physician recommends the following medication changes.  START TAKING: Aspirin 81 mg by mouth daily   *If you need a refill on your cardiac medications before your next appointment, please call your pharmacy*  Lab Work: No labs ordered today    Testing/Procedures: Your provider has ordered a Exercise Myoview Stress test. This will take place at [facility]. Please report to the [facility] medical mall entrance. The volunteers at the first desk will direct you where to go.   [facility] MYOVIEW  Your provider has ordered a Stress Test with nuclear imaging. The purpose of this test is to evaluate the blood supply to your heart muscle. This procedure is referred to as a Non-Invasive Stress Test. This is because other than having an IV started in your vein, nothing is inserted or invades your body. Cardiac stress tests are done to find areas of poor blood flow to the heart by determining the extent of coronary artery disease (CAD). Some patients exercise on a treadmill, which naturally increases the blood flow to your heart, while others who are unable to walk on a treadmill due to physical limitations will have a pharmacologic/chemical stress agent called Lexiscan . This medicine will mimic walking on a treadmill by temporarily increasing your coronary blood flow.   Please note: these test may take anywhere between 2-4 hours to complete  How to prepare for your Myoview test:  Nothing to eat for 6 hours prior to the test No caffeine for 24 hours prior to test No smoking 24 hours prior to test. Your medication may be taken with water. Hold BETA BLOCKER 24 hours prior to test Ladies, please do not wear dresses.  Skirts or pants are appropriate. Please wear a short sleeve shirt. No perfume, cologne or lotion. Wear comfortable walking shoes. No heels!   PLEASE NOTIFY THE OFFICE AT LEAST 24 HOURS IN ADVANCE IF YOU ARE UNABLE TO KEEP YOUR APPOINTMENT.  [phone] AND  PLEASE  NOTIFY NUCLEAR MEDICINE AT [facility] AT LEAST 24 HOURS IN ADVANCE IF YOU ARE UNABLE TO KEEP YOUR APPOINTMENT. [phone]   Follow-Up: At [facility], you and your health needs are our priority.  As part of our continuing mission to provide you with exceptional heart care, our providers are all part of one team.  This team includes your primary Cardiologist (physician) and Advanced Practice Providers or APPs (Physician Assistants and Nurse Practitioners) who all work together to provide you with the care you need, when you need it.  Your next appointment:   1 month(s)  Provider:   You may see Lonni Hanson, MD or one of the following Advanced Practice Providers on your designated Care Team:   Lonni Meager, NP Lesley Maffucci, PA-C Bernardino Bring, PA-C Cadence [place], PA-C Tylene Lunch, NP Barnie Hila, NP

## 2024-03-21 NOTE — Progress Notes (Unsigned)
  Cardiology Office Note:  .   Date:  03/21/2024  ID:  Donna Hoffman, DOB [birth year], MRN [id number] PCP: Myrla Jon HERO, MD  [facility] Providers Cardiologist:  None { Click to update primary MD,subspecialty MD or APP then REFRESH:1}    History of Present Illness: Donna   Glendy Barsanti Hoffman is a 63 y.o. female with history of possible mitral valve prolapse with mild regurgitation, hyperlipidemia, breast cancer, and GERD, who presents to reestablish care.  I previously saw her in 2022 for follow-up of palpitations.  She was feeling well with minimal palpitations at our last visit in 10/2020.  Preceding echo showed structurally normal appearing mitral valve with mild MR.  Event monitor showed predominantly sinus rhythm with rare PACs and PVCs.  Sept 1, walking uphill and had chest pain and numbness in left arm.  Hasn't exercised since.  Stopped and pain resolved in ~3 minutes.  Only episode.  Was a little winded at the time, as it was the Elner Seifert of her 1 mi walk.  Rare palpitations.  No edema or LH/dizziness.  No recent travel.  ROS: See HPI  Studies Reviewed: Donna   EKG Interpretation Date/Time:  Wednesday March 21 2024 15:00:40 EDT Ventricular Rate:  67 PR Interval:  152 QRS Duration:  88 QT Interval:  386 QTC Calculation: 407 R Axis:   44  Text Interpretation: Normal sinus rhythm Normal ECG When compared with ECG of 25-Apr-2013 10:08, No significant change was found Confirmed by Leondre Taul, Lonni [phone]) on 03/21/2024 3:06:34 PM    *** Risk Assessment/Calculations:   {Does this patient have ATRIAL FIBRILLATION?[phone]}         Physical Exam:   VS:  BP 110/72 (BP Location: Left Arm, Patient Position: Sitting)   Pulse 63   Ht 5' 4 (1.626 m)   Wt 154 lb 6.4 oz (70 kg)   LMP 12/05/2012   SpO2 99%   BMI 26.50 kg/m    Wt Readings from Last 3 Encounters:  03/21/24 154 lb 6.4 oz (70 kg)  12/27/23 152 lb 9.6 oz (69.2 kg)  03/18/23 151 lb (68.5 kg)    General:  NAD. Neck: No  JVD or HJR. Lungs: Clear to auscultation bilaterally without wheezes or crackles. Heart: Regular rate and rhythm without murmurs, rubs, or gallops. Abdomen: Soft, nontender, nondistended. Extremities: No lower extremity edema.  ASSESSMENT AND PLAN: .    ***    {Are you ordering a CV Procedure (e.g. stress test, cath, DCCV, TEE, etc)?   Press F2        :[id number]}  Dispo: ***  Signed, Lonni Hanson, MD

## 2024-03-22 ENCOUNTER — Ambulatory Visit: Payer: Self-pay | Admitting: Family Medicine

## 2024-03-22 ENCOUNTER — Encounter: Payer: Self-pay | Admitting: Family Medicine

## 2024-03-22 VITALS — BP 116/88 | HR 65 | Ht 64.0 in | Wt 152.1 lb

## 2024-03-22 DIAGNOSIS — Z0000 Encounter for general adult medical examination without abnormal findings: Secondary | ICD-10-CM

## 2024-03-22 DIAGNOSIS — E7800 Pure hypercholesterolemia, unspecified: Secondary | ICD-10-CM | POA: Diagnosis not present

## 2024-03-22 DIAGNOSIS — E559 Vitamin D deficiency, unspecified: Secondary | ICD-10-CM | POA: Diagnosis not present

## 2024-03-22 NOTE — Progress Notes (Signed)
 Complete physical exam   Patient: Donna Hoffman   DOB: 06-Apr-1961   63 y.o. Female  MRN: [id number] Visit Date: 03/22/2024  Today's healthcare provider: Jon Eva, MD   Chief Complaint  Patient presents with   Annual Exam    Last completed 03/18/23 Diet -  tries low fat Exercise - not since September 1st due to heart concerns Feeling - well Sleeping - fairly well due to concerns with heart issue Concerns -  discuss influenza vaccine and pneumococcal vaccine   Subjective    Donna Hoffman is a 63 y.o. female who presents today for a complete physical exam.   Discussed the use of AI scribe software for clinical note transcription with the patient, who gave verbal consent to proceed.  History of Present Illness   Donna Hoffman is a 63 year old female who presents for an annual physical exam and evaluation of respiratory symptoms.  She experiences chest tightness and a dry cough for several months, with occasional phlegm production providing temporary relief. She has difficulty taking a deep breath during episodes, which last occurred on Sunday at church, and experiences wheezing. No recent illness preceded these symptoms.  There is a family history of COPD; her father died from it, and her mother currently has it. She questions if her symptoms could be related to asthma or allergies but has not identified specific triggers.  She smoked socially as a teenager but denies significant secondhand smoke exposure. Her mother worked in a dusty environment, which she suspects contributed to her mother's COPD.  She is undergoing evaluation for a heart issue that began on September 1st, with a stress test scheduled for next Thursday. She has not been exercising since the heart issue began but maintains a healthy diet.        Last depression screening scores    03/18/2023    8:37 AM 03/01/2022    8:28 AM 02/13/2021    9:11 AM  PHQ 2/9 Scores  PHQ - 2 Score 0 0 0  PHQ- 9 Score  1  0   Last fall risk screening    03/18/2023    8:37 AM  Fall Risk   Falls in the past year? 0  Number falls in past yr: 0  Injury with Fall? 0        Medications: Outpatient Medications Prior to Visit  Medication Sig   aspirin EC 81 MG tablet Take 1 tablet (81 mg total) by mouth daily. Swallow whole.   cholecalciferol (VITAMIN D ) 1000 UNITS tablet Take 1,000 Units by mouth daily.   estradiol  (ESTRACE ) 0.1 MG/GM vaginal cream Place 1 Applicatorful vaginally 3 (three) times a week. At bedtime   famotidine  (PEPCID ) 20 MG tablet Take 1 tablet (20 mg total) by mouth daily as needed for heartburn or indigestion.   Multiple Vitamin (MULTIVITAMIN) tablet Take 1 tablet by mouth daily.   No facility-administered medications prior to visit.    Review of Systems    Objective    BP 116/88 (BP Location: Left Arm, Patient Position: Sitting)   Pulse 65   Ht 5' 4 (1.626 m)   Wt 152 lb 1.6 oz (69 kg)   LMP 12/05/2012   SpO2 100%   BMI 26.11 kg/m    Physical Exam Vitals reviewed.  Constitutional:      General: She is not in acute distress.    Appearance: Normal appearance. She is well-developed. She is not diaphoretic.  HENT:  Head: Normocephalic and atraumatic.     Right Ear: Tympanic membrane, ear canal and external ear normal.     Left Ear: Tympanic membrane, ear canal and external ear normal.     Nose: Nose normal.     Mouth/Throat:     Mouth: Mucous membranes are moist.     Pharynx: Oropharynx is clear. No oropharyngeal exudate.  Eyes:     General: No scleral icterus.    Conjunctiva/sclera: Conjunctivae normal.     Pupils: Pupils are equal, round, and reactive to light.  Neck:     Thyroid: No thyromegaly.  Cardiovascular:     Rate and Rhythm: Normal rate and regular rhythm.     Heart sounds: Normal heart sounds. No murmur heard. Pulmonary:     Effort: Pulmonary effort is normal. No respiratory distress.     Breath sounds: Normal breath sounds. No wheezing or  rales.  Abdominal:     General: There is no distension.     Palpations: Abdomen is soft.     Tenderness: There is no abdominal tenderness.  Musculoskeletal:        General: No deformity.     Cervical back: Neck supple.     Right lower leg: No edema.     Left lower leg: No edema.  Lymphadenopathy:     Cervical: No cervical adenopathy.  Skin:    General: Skin is warm and dry.     Findings: No rash.  Neurological:     Mental Status: She is alert and oriented to person, place, and time. Mental status is at baseline.     Gait: Gait normal.  Psychiatric:        Mood and Affect: Mood normal.        Behavior: Behavior normal.        Thought Content: Thought content normal.      No results found for any visits on 03/22/24.  Assessment & Plan    Routine Health Maintenance and Physical Exam  Exercise Activities and Dietary recommendations  Goals      Exercise 150 minutes per week (moderate activity)        Immunization History  Administered Date(s) Administered   Influenza, Seasonal, Injecte, Preservative Fre 03/18/2023   Influenza,inj,Quad PF,6+ Mos 02/13/2021, 03/01/2022   Moderna Sars-Covid-2 Vaccination 04/18/2020   PFIZER(Purple Top)SARS-COV-2 Vaccination 06/18/2019, 07/14/2019   Td 03/11/1997   Tdap 02/21/2007, 01/21/2017   Zoster Recombinant(Shingrix) 03/27/2021, 07/03/2021    Health Maintenance  Topic Date Due   Pneumococcal Vaccine: 50+ Years (1 of 2 - PCV) Never done   Influenza Vaccine  01/13/2024   COVID-19 Vaccine (4 - 2025-26 season) 02/13/2024   DTaP/Tdap/Td (4 - Td or Tdap) 01/22/2027   Colonoscopy  03/24/2031   Hepatitis C Screening  Completed   HIV Screening  Completed   Zoster Vaccines- Shingrix  Completed   Hepatitis B Vaccines 19-59 Average Risk  Aged Out   HPV VACCINES  Aged Out   Meningococcal B Vaccine  Aged Out    Discussed health benefits of physical activity, and encouraged her to engage in regular exercise appropriate for her age and  condition.  Problem List Items Addressed This Visit       Other   Hypercholesterolemia   Relevant Orders   Comprehensive metabolic panel with GFR   Lipid panel   Avitaminosis D   Relevant Orders   VITAMIN D  25 Hydroxy (Vit-D Deficiency, Fractures)   Other Visit Diagnoses       Encounter  for annual physical exam    -  Primary   Relevant Orders   Comprehensive metabolic panel with GFR   Lipid panel   VITAMIN D  25 Hydroxy (Vit-D Deficiency, Fractures)           Cardiac symptoms Cardiac symptoms since September 1st, with stress test scheduled for further evaluation and risk stratification. Coordination with cardiologist Dr. LOISE. Cholesterol levels to be assessed. - Proceed with stress test as scheduled - Coordinate with Dr. LOISE for evaluation results - Order cholesterol lab tests  Hyperlipidemia Potential increase in cholesterol due to lack of exercise since cardiac symptoms began. Diet remains consistent with grilled foods. - Review cholesterol results with Dr. LOISE for risk stratification  Intermittent dyspnea and cough Intermittent dyspnea and dry cough with occasional productive cough. No smoking history, family history of COPD. Differential includes asthma, allergies, or environmental factors. Wheezing only reported with inhalation, not in throat. - Auscultate lungs for further evaluation  Recurrent urinary tract infections, post-hysterectomy Recurrent UTIs post-hysterectomy. - Continue external application of estradiol  cream  Cerumen impaction, right ear Cerumen impaction in the right ear, likely due to Bluetooth use. Hard wax accumulation. - Recommend over-the-counter D-Drox drops to soften earwax  Adult Wellness Visit Routine adult wellness visit. Cancer screenings discussed, colonoscopy up to date. No need for mammograms or Pap smears due to hysterectomy. Vaccines discussed: flu, RSV, and pneumonia. Vitamin D  supplementation and lab work for cholesterol, kidney, and  liver function discussed. - Schedule flu and pneumonia vaccines with nurses after return from trip - Order labs for cholesterol, kidney, and liver function, and vitamin D         Return in about 1 year (around 03/22/2025) for CPE and 1-2 weeks for CMA visit for flu and pcv.     Jon Eva, MD  [facility] [phone] (phone) [phone] (fax)  [facility]

## 2024-03-23 ENCOUNTER — Encounter: Payer: Self-pay | Admitting: Internal Medicine

## 2024-03-23 LAB — COMPREHENSIVE METABOLIC PANEL WITH GFR
ALT: 14 IU/L (ref 0–32)
AST: 15 IU/L (ref 0–40)
Albumin: 4.4 g/dL (ref 3.9–4.9)
Alkaline Phosphatase: 116 IU/L (ref 49–135)
BUN/Creatinine Ratio: 22 (ref 12–28)
BUN: 15 mg/dL (ref 8–27)
Bilirubin Total: 0.6 mg/dL (ref 0.0–1.2)
CO2: 23 mmol/L (ref 20–29)
Calcium: 9.6 mg/dL (ref 8.7–10.3)
Chloride: 102 mmol/L (ref 96–106)
Creatinine, Ser: 0.67 mg/dL (ref 0.57–1.00)
Globulin, Total: 2.6 g/dL (ref 1.5–4.5)
Glucose: 88 mg/dL (ref 70–99)
Potassium: 4.6 mmol/L (ref 3.5–5.2)
Sodium: 140 mmol/L (ref 134–144)
Total Protein: 7 g/dL (ref 6.0–8.5)
eGFR: 98 mL/min/1.73 (ref >59)

## 2024-03-23 LAB — LIPID PANEL
Chol/HDL Ratio: 4.2 ratio (ref 0.0–4.4)
Cholesterol, Total: 256 mg/dL — ABNORMAL HIGH (ref 100–199)
HDL: 61 mg/dL (ref >39)
LDL Chol Calc (NIH): 174 mg/dL — ABNORMAL HIGH (ref 0–99)
Triglycerides: 118 mg/dL (ref 0–149)
VLDL Cholesterol Cal: 21 mg/dL (ref 5–40)

## 2024-03-23 LAB — VITAMIN D 25 HYDROXY (VIT D DEFICIENCY, FRACTURES): Vit D, 25-Hydroxy: 37 ng/mL (ref 30.0–100.0)

## 2024-03-28 ENCOUNTER — Ambulatory Visit: Payer: Self-pay | Admitting: Family Medicine

## 2024-03-29 ENCOUNTER — Encounter
Admission: RE | Admit: 2024-03-29 | Discharge: 2024-03-29 | Disposition: A | Discharge status: Home / Self Care | Source: Ambulatory Visit | Attending: Internal Medicine | Admitting: Internal Medicine

## 2024-03-29 DIAGNOSIS — R079 Chest pain, unspecified: Secondary | ICD-10-CM | POA: Diagnosis present

## 2024-03-29 MED ORDER — TECHNETIUM TC 99M TETROFOSMIN IV KIT
32.7600 | PACK | Freq: Once | INTRAVENOUS | Status: AC | PRN
  Administered 2024-03-29: 32.76 via INTRAVENOUS

## 2024-03-29 MED ORDER — TECHNETIUM TC 99M TETROFOSMIN IV KIT
10.3900 | PACK | Freq: Once | INTRAVENOUS | Status: AC | PRN
  Administered 2024-03-29: 10.39 via INTRAVENOUS

## 2024-03-30 ENCOUNTER — Ambulatory Visit: Payer: Self-pay | Admitting: Internal Medicine

## 2024-03-30 LAB — NM MYOCAR MULTI W/SPECT W/WALL MOTION / EF
Angina Index: 0
Duke Treadmill Score: 7
Exercise duration (min): 7 min
Exercise duration (sec): 15 s
LV dias vol: 97 mL (ref 46–106)
LV sys vol: 25 mL (ref 3.8–5.2)
MPHR: 133 {beats}/min
Nuc Stress EF: 74 %
Peak HR: 141 {beats}/min
Percent HR: 89 %
Rest HR: 82 {beats}/min
Rest Nuclear Isotope Dose: 10.4 mCi
SDS: 0
SRS: 0
SSS: 0
ST Depression (mm): 0 mm
Stress Nuclear Isotope Dose: 32.8 mCi
TID: 1.06

## 2024-04-05 ENCOUNTER — Ambulatory Visit

## 2024-04-12 ENCOUNTER — Encounter: Payer: Self-pay | Admitting: Family Medicine

## 2024-04-12 ENCOUNTER — Ambulatory Visit: Admitting: Family Medicine

## 2024-04-12 VITALS — BP 121/66 | HR 83 | Ht 64.0 in | Wt 152.1 lb

## 2024-04-12 DIAGNOSIS — Z23 Encounter for immunization: Secondary | ICD-10-CM

## 2024-04-12 DIAGNOSIS — R300 Dysuria: Secondary | ICD-10-CM | POA: Diagnosis not present

## 2024-04-12 LAB — POCT URINALYSIS DIPSTICK
Bilirubin, UA: NEGATIVE
Blood, UA: NEGATIVE
Glucose, UA: NEGATIVE
Ketones, UA: NEGATIVE
Leukocytes, UA: NEGATIVE
Nitrite, UA: NEGATIVE
Protein, UA: NEGATIVE
Spec Grav, UA: >=1.03 — AB (ref 1.010–1.025)
Urobilinogen, UA: 0.2 U/dL
pH, UA: 6 (ref 5.0–8.0)

## 2024-04-12 MED ORDER — FAMOTIDINE 20 MG PO TABS: 20.0000 mg | ORAL_TABLET | Freq: Every day | ORAL | 3 refills | Status: AC | PRN

## 2024-04-12 NOTE — Progress Notes (Signed)
 Acute visit   Patient: Donna Hoffman   DOB: Aug 10, 1960   63 y.o. Female  MRN: [id number] PCP: Myrla Jon HERO, MD   Chief Complaint  Patient presents with   Urinary Tract Infection   Medication Refill   Immunizations   Subjective    Discussed the use of AI scribe software for clinical note transcription with the patient, who gave verbal consent to proceed.  History of Present Illness   Donna Hoffman is a 63 year old female who presents with urinary symptoms.  She experienced irritation on Monday and Tuesday, suspecting a urinary tract infection. She felt fine on Wednesday and Thursday. She increased her intake of cranberry vitamins and cranberry juice and hydrated extensively, which she believes helped alleviate her symptoms.        Review of Systems  Objective    BP 121/66   Pulse 83   Ht 5' 4 (1.626 m)   Wt 152 lb 1.6 oz (69 kg)   LMP 12/05/2012   SpO2 100%   BMI 26.11 kg/m  Physical Exam Vitals reviewed.  Constitutional:      General: She is not in acute distress.    Appearance: She is well-developed.  HENT:     Head: Normocephalic and atraumatic.  Eyes:     General: No scleral icterus.    Conjunctiva/sclera: Conjunctivae normal.  Cardiovascular:     Rate and Rhythm: Normal rate and regular rhythm.  Pulmonary:     Effort: Pulmonary effort is normal. No respiratory distress.  Skin:    General: Skin is warm and dry.     Findings: No rash.  Neurological:     Mental Status: She is alert and oriented to person, place, and time.  Psychiatric:        Behavior: Behavior normal.       Results for orders placed or performed in visit on 04/12/24  POCT Urinalysis Dipstick  Result Value Ref Range   Color, UA     Clarity, UA     Glucose, UA Negative Negative   Bilirubin, UA negative    Ketones, UA negative    Spec Grav, UA >=1.030 (A) 1.010 - 1.025   Blood, UA negative    pH, UA 6.0 5.0 - 8.0   Protein, UA Negative Negative   Urobilinogen, UA  0.2 0.2 or 1.0 E.U./dL   Nitrite, UA negative    Leukocytes, UA Negative Negative   Appearance     Odor      Assessment & Plan     Problem List Items Addressed This Visit   None Visit Diagnoses       Dysuria    -  Primary   Relevant Orders   POCT Urinalysis Dipstick (Completed)     Immunization due       Relevant Orders   Pneumococcal conjugate vaccine 20-valent (Completed)           Resolved urinary symptoms Urinary symptoms earlier in the week suggestive of a possible UTI resolved by Wednesday. Urinalysis showed clear urine with no signs of infection, including absence of bacteria, white blood cells, or blood. Increased hydration and cranberry intake likely contributed to symptom resolution. - Discuss potential bladder irritants such as caffeine, carbonation, and spicy foods. - Send Pepcid  prescription to [facility] in [place].  Encounter for immunization Received scheduled immunization with no adverse reactions reported.        Meds ordered this encounter  Medications   famotidine  (PEPCID )  20 MG tablet    Sig: Take 1 tablet (20 mg total) by mouth daily as needed for heartburn or indigestion.    Dispense:  90 tablet    Refill:  3     Return if symptoms worsen or fail to improve.      Jon Eva, MD  [facility] [phone] (phone) [phone] (fax)  [facility]

## 2024-04-12 NOTE — Progress Notes (Deleted)
 Patient is in office today for a nurse visit for Immunization. Patient Injection was given in the  {Injection site:18846}. Patient tolerated injection well.

## 2024-04-23 ENCOUNTER — Encounter: Payer: Self-pay | Admitting: Physician Assistant

## 2024-04-23 ENCOUNTER — Ambulatory Visit: Attending: Physician Assistant | Admitting: Physician Assistant

## 2024-04-23 VITALS — BP 120/80 | HR 75 | Ht 64.0 in | Wt 153.0 lb

## 2024-04-23 DIAGNOSIS — I340 Nonrheumatic mitral (valve) insufficiency: Secondary | ICD-10-CM | POA: Diagnosis not present

## 2024-04-23 DIAGNOSIS — Z87898 Personal history of other specified conditions: Secondary | ICD-10-CM | POA: Diagnosis not present

## 2024-04-23 DIAGNOSIS — E782 Mixed hyperlipidemia: Secondary | ICD-10-CM

## 2024-04-23 NOTE — Progress Notes (Signed)
 Cardiology Office Note    Date:  04/23/2024   ID:  Donna Hoffman, DOB 1961-03-01, MRN [id number]  PCP:  Myrla Jon HERO, MD  Cardiologist:  Lonni Hanson, MD  Electrophysiologist:  None   Chief Complaint: Follow-up  History of Present Illness:   Donna Hoffman is a 63 y.o. female with history of possible mitral valve prolapse with mild mitral regurgitation, HLD, breast cancer, and GERD who presents for follow-up of treadmill MPI.  She was evaluated in 2022 for palpitations.  Zio patch in 09/2020 showed a predominant rhythm of sinus with rare PACs and PVCs.  No significant arrhythmia identified.  Patient triggered events mostly corresponded to sinus rhythm with PVCs.  Echo in 10/2020 showed an EF of 55%, no regional wall motion abnormalities, grade 1 diastolic dysfunction, normal RV systolic function and ventricular cavity size, and mild mitral regurgitation.  She was seen in the office in 03/2024 to reestablish care and reported chest discomfort with associated numbness in the left arm while walking up a hill the month prior with improvement in symptoms within 3 minutes upon resting.  EKG showed no acute ischemic changes.  Treadmill MPI on 03/29/2024 showed no significant ischemia with an EF of 74%.  No EKG changes concerning for ischemia at peak stress or in recovery.  Patient achieved 7 METs, exercising for 7 minutes and 15 seconds with a peak blood pressure of 202/82.  Overall, this was a low risk scan.  CT attenuation corrected imaging showed no significant coronary artery calcification or aortic atherosclerosis.  She comes in doing well from a cardiac perspective and is without symptoms of angina or cardiac decompensation.  She has not had any further chest pain, though has not resumed prior walking regimen yet.  However, she is exercising some.  No lower extremity swelling.  Follows a heart healthy diet.  Blood pressure well-controlled in the office today.   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  Plan adjuvant chemotherapy.   Allergy    Arthritis    Osteoarthritis   GERD (gastroesophageal reflux disease)    Heart murmur    Malignant neoplasm of breast (female), unspecified site 05/11/2010   Invasive ductal carcinoma, extensive DCIS. 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   BREAST SURGERY Right 05/28/2010   Right simple mastectomy/sentinel node biopsy/breast ca   COLONOSCOPY  2012   COLONOSCOPY  03/09/2011   Normal exam   COLONOSCOPY WITH PROPOFOL  N/A 03/23/2021   Procedure: COLONOSCOPY WITH PROPOFOL ;  Surgeon: Dessa Reyes ORN, MD;  Location: [facility] ENDOSCOPY;  Service: Endoscopy;  Laterality: N/A;   DILATION AND CURETTAGE OF UTERUS     excision chest wall mass     MASTECTOMY Bilateral 2011,2012   UPPER GI ENDOSCOPY      Current Medications: Current Meds  Medication Sig   cholecalciferol (VITAMIN D ) 1000 UNITS tablet Take 1,000 Units by  mouth daily.   estradiol  (ESTRACE ) 0.1 MG/GM vaginal cream Place 1 Applicatorful vaginally 3 (three) times a week. At bedtime   famotidine  (PEPCID ) 20 MG tablet Take 1 tablet (20 mg total) by mouth daily as needed for heartburn or indigestion.   meloxicam (MOBIC) 7.5 MG tablet Take 7.5 mg by mouth daily. As needed for pain   Multiple Vitamin (MULTIVITAMIN) tablet Take 1 tablet by mouth daily.   [DISCONTINUED] aspirin EC 81 MG tablet  Take 1 tablet (81 mg total) by mouth daily. Swallow whole.    Allergies:   Iodinated contrast media, Epinephrine, Neomycin, Other, and Penicillins   Social History   Socioeconomic History   Marital status: Married    Spouse name: Garrel   Number of children: 2   Years of education: some college   Highest education level: Associate degree: occupational, scientist, product/process development, or vocational program  Occupational History    Employer: [employer]  Tobacco Use   Smoking status: Former    Types: Cigarettes   Smokeless tobacco: Never   Tobacco comments:    Smoked occasionally as a teenager  Vaping Use   Vaping status: Never Used  Substance and Sexual Activity   Alcohol use: Not Currently    Comment: occassionally   Drug use: Never   Sexual activity: Yes    Birth control/protection: Post-menopausal  Other Topics Concern   Not on file  Social History Narrative   Not on file   Social Drivers of Health   Financial Resource Strain: Low Risk  (03/19/2024)   Overall Financial Resource Strain (CARDIA)    Difficulty of Paying Living Expenses: Not hard at all  Food Insecurity: No Food Insecurity (03/19/2024)   Hunger Vital Sign    Worried About Running Out of Food in the Last Year: Never true    Ran Out of Food in the Last Year: Never true  Transportation Needs: No Transportation Needs (03/19/2024)   PRAPARE - Administrator, Civil Service (Medical): No    Lack of Transportation (Non-Medical): No  Physical Activity: Insufficiently Active (03/19/2024)   Exercise Vital Sign    Days of Exercise per Week: 1 day    Minutes of Exercise per Session: 30 min  Stress: No Stress Concern Present (03/19/2024)   [employer] of Occupational Health - Occupational Stress Questionnaire    Feeling of Stress: Not at all  Social Connections: Socially Integrated (03/19/2024)   Social Connection and Isolation Panel    Frequency of Communication with Friends and Family: Three times a week     Frequency of Social Gatherings with Friends and Family: Twice a week    Attends Religious Services: More than 4 times per year    Active Member of [employer] or Organizations: Yes    Attends Engineer, Structural: More than 4 times per year    Marital Status: Married     Family History:  The patient's family history includes Arthritis in her mother and sister; Breast cancer in her mother; Cancer in her father and mother; Colon cancer in her paternal grandfather; Colon polyps in her father; Diabetes in her father; Healthy in her brother, sister, and sister; Hearing loss in her mother; Heart attack (age of onset: 73) in her paternal grandmother; Heart attack (age of onset: 31) in her father; Heart disease in her father; Hypertension in her father; Kidney disease in her father.  ROS:   12-point review of systems is negative unless otherwise noted in the HPI.  EKGs/Labs/Other Studies Reviewed:    Studies reviewed were summarized above. The additional studies were reviewed today:  Zio patch 09/2020: The patient was monitored for 4 days, 10 hours. 3 days, 17 hours were adequate for analysis after removal of artifact. The predominant rhythm was sinus with an average rate of 80 bpm (range 58-141 bpm). Rare PACs and PVCs were observed. No sustained arrhythmia or prolonged pause occurred. There were 79 patient triggered events during the monitoring period, most of which correspond to sinus rhythm with PVCs. One triggered event also contains a PAC.   Predominately sinus rhythm with rare PACs and PVCs.  Symptoms are primarily associated with PVCs.  No significant arrhythmia identified. __________  2D echo 10/24/2020: 1. Left ventricular ejection fraction, by estimation, is 55 %. The left  ventricle has normal function. The left ventricle has no regional wall  motion abnormalities. Left ventricular diastolic parameters are consistent  with Grade I diastolic dysfunction  (impaired relaxation).    2. Right ventricular systolic function is normal. The right ventricular  size is normal. Tricuspid regurgitation signal is inadequate for assessing  PA pressure.   3. The mitral valve is normal in structure. Mild mitral valve  regurgitation.  __________  Treadmill MPI 03/29/2024: Exercise myocardial perfusion imaging study with no significant  ischemia Normal wall motion, EF estimated at 74% No EKG changes concerning for ischemia at peak stress or in recovery. Target heart rate achieved, 7 METS, exercise time 7 minutes 15 seconds, peak blood pressure 202/82, down to 127/75 in recovery.  Peak heart rate 141 bpm, heart rate down to 88 bpm at 3 minutes in recovery CT attenuation correction images with no significant atherosclerosis in the aorta or coronary arteries Low risk scan  EKG:  EKG is not ordered today.   Recent Labs: 03/22/2024: ALT 14; BUN 15; Creatinine, Ser 0.67; Potassium 4.6; Sodium 140  Recent Lipid Panel    Component Value Date/Time   CHOL 256 (H) 03/22/2024 0848   TRIG 118 03/22/2024 0848   HDL 61 03/22/2024 0848   CHOLHDL 4.2 03/22/2024 0848   LDLCALC 174 (H) 03/22/2024 0848    PHYSICAL EXAM:    VS:  BP 120/80 (BP Location: Left Arm, Patient Position: Sitting, Cuff Size: Normal)   Pulse 75   Ht 5' 4 (1.626 m)   Wt 153 lb (69.4 kg)   LMP 12/05/2012   SpO2 98%   BMI 26.26 kg/m   BMI: Body mass index is 26.26 kg/m.  Physical Exam Vitals reviewed.  Constitutional:      Appearance: She is well-developed.  HENT:     Head: Normocephalic and atraumatic.  Eyes:     General:        Right eye: No discharge.        Left eye: No discharge.  Cardiovascular:     Rate and Rhythm: Normal rate and regular rhythm.     Heart sounds: Normal heart sounds, S1 normal and S2 normal. Heart sounds not distant. No midsystolic click and no opening snap. No murmur heard.    No friction rub.  Pulmonary:     Effort: Pulmonary effort is normal. No respiratory distress.      Breath sounds: Normal breath sounds. No decreased breath sounds, wheezing, rhonchi or rales.  Musculoskeletal:     Cervical back: Normal range of motion.     Right lower leg: No edema.     Left lower leg: No edema.  Skin:    General: Skin is warm and  dry.     Nails: There is no clubbing.  Neurological:     Mental Status: She is alert and oriented to person, place, and time.  Psychiatric:        Speech: Speech normal.        Behavior: Behavior normal.        Thought Content: Thought content normal.        Judgment: Judgment normal.     Wt Readings from Last 3 Encounters:  04/23/24 153 lb (69.4 kg)  04/12/24 152 lb 1.6 oz (69 kg)  03/22/24 152 lb 1.6 oz (69 kg)     ASSESSMENT & PLAN:   Chest pain: No further episodes.  Recent treadmill MPI without evidence of ischemia with preserved LV systolic function and no acute ischemic changes on EKG.  CT attenuation corrected imaging showed no evidence of coronary calcification or aortic atherosclerosis.  Can discontinue aspirin at this point.  Continue aggressive risk factor modification and primary prevention.  Mitral regurgitation: Mild by echo in 2022.  No murmur noted on exam today.  HLD: LDL 174 in 03/2024.  She will work on lifestyle modification and regular exercise.  Defer addition of statin given lack of coronary artery calcification and aortic atherosclerosis noted on CT imaging.  Trend fasting lipid panel in 6 months.   Disposition: F/u with Dr. Mady or an APP in 6 months.   Medication Adjustments/Labs and Tests Ordered: Current medicines are reviewed at length with the patient today.  Concerns regarding medicines are outlined above. Medication changes, Labs and Tests ordered today are summarized above and listed in the Patient Instructions accessible in Encounters.   Signed, Bernardino Bring, PA-C 04/23/2024 12:51 PM     [facility] [place] [address] [place], [place] [address] [phone]

## 2024-04-23 NOTE — Patient Instructions (Signed)
 Medication Instructions:  Your physician recommends the following medication changes.  STOP TAKING: Aspirin 81 mg   *If you need a refill on your cardiac medications before your next appointment, please call your pharmacy*  Lab Work: None ordered at this time   Follow-Up: At [facility], you and your health needs are our priority.  As part of our continuing mission to provide you with exceptional heart care, our providers are all part of one team.  This team includes your primary Cardiologist (physician) and Advanced Practice Providers or APPs (Physician Assistants and Nurse Practitioners) who all work together to provide you with the care you need, when you need it.  Your next appointment:   6 month(s)  Provider:   You may see Lonni Hanson, MD or Bernardino Bring, PA-C

## 2024-05-24 ENCOUNTER — Other Ambulatory Visit: Payer: Self-pay | Admitting: Family Medicine

## 2024-10-26 ENCOUNTER — Ambulatory Visit: Admitting: Physician Assistant

## 2025-03-26 ENCOUNTER — Encounter: Admitting: Family Medicine
